# Patient Record
Sex: Female | Born: 1980 | Race: White | Hispanic: No | Marital: Single | Smoking: Current every day smoker
Health system: Southern US, Community
[De-identification: ages and names within clinical notes are randomized; demographics above are authoritative.]

## PROBLEM LIST (undated history)

## (undated) HISTORY — PX: GALLBLADDER SURGERY: SHX652

---

## 2004-10-23 ENCOUNTER — Observation Stay: Payer: Self-pay | Admitting: Obstetrics and Gynecology

## 2004-12-02 ENCOUNTER — Observation Stay: Payer: Self-pay

## 2005-01-03 ENCOUNTER — Observation Stay: Payer: Self-pay | Admitting: Obstetrics and Gynecology

## 2005-01-10 ENCOUNTER — Inpatient Hospital Stay: Payer: Self-pay

## 2005-07-21 ENCOUNTER — Emergency Department: Payer: Self-pay | Admitting: Emergency Medicine

## 2005-09-18 ENCOUNTER — Ambulatory Visit: Payer: Self-pay | Admitting: General Surgery

## 2010-01-08 ENCOUNTER — Ambulatory Visit: Payer: Self-pay | Admitting: Family Medicine

## 2011-07-13 ENCOUNTER — Ambulatory Visit: Payer: Self-pay | Admitting: Internal Medicine

## 2011-09-24 ENCOUNTER — Inpatient Hospital Stay: Payer: Self-pay

## 2012-05-07 ENCOUNTER — Ambulatory Visit: Payer: Self-pay | Admitting: Family Medicine

## 2017-10-28 NOTE — L&D Delivery Note (Signed)
Delivery Note At 9:54 PM a viable and healthy female "Aline Brochure" was delivered via Vaginal, Spontaneous (Presentation: LOA ).  APGAR: 8, 9; weight  pending   Placenta status: spntaneous.  Cord: 3vc with the following complications: none.  Anesthesia:  none Episiotomy: None Lacerations: None Suture Repair: none Est. Blood Loss (mL): 200  Mom to postpartum.  Baby to Couplet care / Skin to Skin.  37yo [id number] at 39+0wks admitted with contractions at term and found to have late decels on monitoring. She was augmented with pitocin and AROM for  Meconium stained fluid. She progressed to fully dilated without an epidural, but with iv stadol, and began to push involuntarily over an intact perineum. She delivered with the RN at the bedside and I arrived as the baby was placed on maternal abdomen. There were no lacerations. I doubly clamped and cut the cord and the placenta delivered spontaneously, intact. 3VC, small periclioral laceration that was hemostatic, and her fundus was firm. She received postpartum pitocin and bleeding was minimal. She and baby tolerated the procedure well.  She plans for a postpartum BTL. This has been posted.  [name] 07/30/2018, 11:22 PM

## 2018-01-08 LAB — OB RESULTS CONSOLE HEPATITIS B SURFACE ANTIGEN: Hepatitis B Surface Ag: NEGATIVE

## 2018-01-08 LAB — OB RESULTS CONSOLE RPR: RPR: NONREACTIVE

## 2018-01-08 LAB — OB RESULTS CONSOLE VARICELLA ZOSTER ANTIBODY, IGG: VARICELLA IGG: IMMUNE

## 2018-01-08 LAB — OB RESULTS CONSOLE RUBELLA ANTIBODY, IGM: RUBELLA: IMMUNE

## 2018-01-08 LAB — OB RESULTS CONSOLE ABO/RH: RH TYPE: NEGATIVE

## 2018-01-08 LAB — OB RESULTS CONSOLE HIV ANTIBODY (ROUTINE TESTING): HIV: NONREACTIVE

## 2018-01-09 ENCOUNTER — Other Ambulatory Visit: Payer: Self-pay | Admitting: Obstetrics and Gynecology

## 2018-01-09 DIAGNOSIS — Z369 Encounter for antenatal screening, unspecified: Secondary | ICD-10-CM

## 2018-01-29 ENCOUNTER — Ambulatory Visit (HOSPITAL_BASED_OUTPATIENT_CLINIC_OR_DEPARTMENT_OTHER)
Admission: RE | Admit: 2018-01-29 | Discharge: 2018-01-29 | Disposition: A | Discharge status: Home / Self Care | Payer: Medicaid Other | Source: Ambulatory Visit | Attending: Maternal & Fetal Medicine | Admitting: Maternal & Fetal Medicine

## 2018-01-29 ENCOUNTER — Ambulatory Visit
Admission: RE | Admit: 2018-01-29 | Discharge: 2018-01-29 | Disposition: A | Discharge status: Home / Self Care | Payer: Medicaid Other | Source: Ambulatory Visit | Attending: Obstetrics and Gynecology | Admitting: Obstetrics and Gynecology

## 2018-01-29 VITALS — BP 132/89 | HR 109 | Temp 98.1°F | Resp 18 | Ht 64.8 in | Wt 164.0 lb

## 2018-01-29 DIAGNOSIS — Z3689 Encounter for other specified antenatal screening: Secondary | ICD-10-CM | POA: Insufficient documentation

## 2018-01-29 DIAGNOSIS — Z369 Encounter for antenatal screening, unspecified: Secondary | ICD-10-CM

## 2018-01-29 DIAGNOSIS — O99331 Smoking (tobacco) complicating pregnancy, first trimester: Secondary | ICD-10-CM | POA: Insufficient documentation

## 2018-01-29 DIAGNOSIS — Z3A13 13 weeks gestation of pregnancy: Secondary | ICD-10-CM | POA: Insufficient documentation

## 2018-01-29 DIAGNOSIS — O09521 Supervision of elderly multigravida, first trimester: Secondary | ICD-10-CM | POA: Insufficient documentation

## 2018-01-29 DIAGNOSIS — F17210 Nicotine dependence, cigarettes, uncomplicated: Secondary | ICD-10-CM | POA: Diagnosis not present

## 2018-01-29 NOTE — Progress Notes (Addendum)
Referring Provider:   [facility] Length of Consultation: 40 minutes  Ms. Bitton [facility] for genetic counseling because of advanced maternal age.  The patient will be 37 years old at the time of delivery.  This note summarizes the information we discussed.    We explained that the chance of a chromosome abnormality increases with maternal age.  Chromosomes and examples of chromosome problems were reviewed.  Humans typically have 46 chromosomes in each cell, with half passed through each sperm and egg.  Any change in the number or structure of chromosomes can increase the risk of problems in the physical and mental development of a pregnancy.   Based upon age of the patient, the chance of any chromosome abnormality was 1 in 7687. The chance of Down syndrome, the most common chromosome problem associated with maternal age, was 1 in 89175.  The risk of chromosome problems is in addition to the 3% general population risk for birth defects and mental retardation.  The greatest chance, of course, is that the baby would be born in good health.  We discussed the following prenatal screening and testing options for this pregnancy:  First trimester screening, which includes nuchal translucency ultrasound screen and first trimester maternal serum marker screening.  The nuchal translucency has approximately an 80% detection rate for Down syndrome and can be positive for other chromosome abnormalities as well as heart defects.  When combined with a maternal serum marker screening, the detection rate is up to 90% for Down syndrome and up to 97% for trisomy 18.     The chorionic villus sampling procedure is available for first trimester chromosome analysis.  This involves the withdrawal of a small amount of chorionic villi (tissue from the developing placenta).  Risk of pregnancy loss is estimated to be approximately 1 in 200 to 1 in 100 (0.5 to 1%).  There is  approximately a 1% (1 in 100) chance that the CVS chromosome results will be unclear.  Chorionic villi cannot be tested for neural tube defects.     Maternal serum marker screening, a blood test that measures pregnancy proteins, can provide risk assessments for Down syndrome, trisomy 18, and open neural tube defects (spina bifida, anencephaly). Because it does not directly examine the fetus, it cannot positively diagnose or rule out these problems.  Targeted ultrasound uses high frequency sound waves to create an image of the developing fetus.  An ultrasound is often recommended as a routine means of evaluating the pregnancy.  It is also used to screen for fetal anatomy problems (for example, a heart defect) that might be suggestive of a chromosomal or other abnormality.   Amniocentesis involves the removal of a small amount of amniotic fluid from the sac surrounding the fetus with the use of a thin needle inserted through the maternal abdomen and uterus.  Ultrasound guidance is used throughout the procedure.  Fetal cells from amniotic fluid are directly evaluated and > 99.5% of chromosome problems and > 98% of open neural tube defects can be detected. This procedure is generally performed after the 15th week of pregnancy.  The main risks to this procedure include complications leading to miscarriage in less than 1 in 200 cases (0.5%).  We also reviewed the availability of cell free fetal DNA testing from maternal blood to determine whether or not the baby may have either Down syndrome, trisomy 5113, or trisomy 10418.  This test utilizes a maternal blood sample and  DNA sequencing technology to isolate circulating cell free fetal DNA from maternal plasma.  The fetal DNA can then be analyzed for DNA sequences that are derived from the three most common chromosomes involved in aneuploidy, chromosomes 13, 18, and 21.  If the overall amount of DNA is greater than the expected level for any of these chromosomes,  aneuploidy is suspected.  While we do not consider it a replacement for invasive testing and karyotype analysis, a negative result from this testing would be reassuring, though not a guarantee of a normal chromosome complement for the baby.  An abnormal result is certainly suggestive of an abnormal chromosome complement, though we would still recommend CVS or amniocentesis to confirm any findings from this testing.  Cystic Fibrosis and Spinal Muscular Atrophy (SMA) screening were also discussed with the patient. Both conditions are recessive, which means that both parents must be carriers in order to have a child with the disease.  Cystic fibrosis (CF) is one of the most common genetic conditions in persons of Caucasian ancestry.  This condition occurs in approximately 1 in 2,500 Caucasian persons and results in thickened secretions in the lungs, digestive, and reproductive systems.  For a baby to be at risk for having CF, both of the parents must be carriers for this condition.  Approximately 1 in 33 Caucasian persons is a carrier for CF.  Current carrier testing looks for the most common mutations in the gene for CF and can detect approximately 90% of carriers in the Caucasian population.  This means that the carrier screening can greatly reduce, but cannot eliminate, the chance for an individual to have a child with CF.  If an individual is found to be a carrier for CF, then carrier testing would be available for the partner. As part of [place]'s newborn screening profile, all babies born in the state of [place] will have a two-tier screening process.  Specimens are first tested to determine the concentration of immunoreactive trypsinogen (IRT).  The top 5% of specimens with the highest IRT values then undergo DNA testing using a panel of over 40 common CF mutations. SMA is a neurodegenerative disorder that leads to atrophy of skeletal muscle and overall weakness.  This condition is also more  prevalent in the Caucasian population, with 1 in 40-1 in 60 persons being a carrier and 1 in 6,000-1 in 10,000 children being affected.  There are multiple forms of the disease, with some causing death in infancy to other forms with survival into adulthood.  The genetics of SMA is complex, but carrier screening can detect up to 95% of carriers in the Caucasian population.  Similar to CF, a negative result can greatly reduce, but cannot eliminate, the chance to have a child with SMA.  We obtained a detailed family history and pregnancy history.  Ms. White reported that her maternal grandmother was born deaf.  She was one of seven siblings, and no one else in the family has hearing loss. We reviewed that there may be many reasons for hearing loss including congenital infections, postnatal factors and genetic conditions. The grandmother had no other physical or developmental differences that would suggest an underlying genetic syndrome.  However, without additional medical information about the cause for her condition, we cannot accurately assess the chance for other family members to have a similar condition. Hearing screening is performed on all babies in [place] as part of newborn screening prior to leaving the hospital.  The remainder of the family  history is unremarkable for birth defects, developmental delays, recurrent pregnancy loss or known chromosome abnormalities.  Ms. Quesenberry stated that this is her fourth pregnancy, the second with her current partner.  They have a healthy 72 year old daughter and she has two daughters, ages 79 and 66, who are also in good health.  Ms. Arreaga smokes cigarettes, down from 1/2 pack per day to about 3 per day.  As we discussed, smoking during pregnancy has been associated with low birth weight, premature delivery and pregnancy loss.  For this reason, we suggest that she cut back or avoid smoking for the remainder of the pregnancy. She reported no other complications or exposures  in this pregnancy that would be expected to increase the risk for birth defects.  After consideration of the options, Ms. Cregg elected to proceed with an ultrasound only and to decline all other screening and testing options.  An ultrasound was performed at the time of the visit.  The gestational age was consistent with 13 weeks.  Fetal anatomy could not be assessed due to early gestational age.  Please refer to the ultrasound report for details of that study.  Ms. Salinas was encouraged to call with questions or concerns.  We can be contacted at [phone].   Tests Ordered: none   Cherly Anderson, MS, CGC  I was immediately available and supervising. Argentina Ponder, MD [facility]

## 2018-07-10 LAB — OB RESULTS CONSOLE GC/CHLAMYDIA
CHLAMYDIA, DNA PROBE: NEGATIVE
GC PROBE AMP, GENITAL: NEGATIVE

## 2018-07-10 LAB — OB RESULTS CONSOLE GBS: GBS: NEGATIVE

## 2018-07-30 ENCOUNTER — Other Ambulatory Visit: Payer: Self-pay

## 2018-07-30 ENCOUNTER — Inpatient Hospital Stay
Admission: EM | Admit: 2018-07-30 | Discharge: 2018-08-01 | DRG: 798 | Disposition: A | Discharge status: Home / Self Care | Payer: BLUE CROSS/BLUE SHIELD | Attending: Obstetrics and Gynecology | Admitting: Obstetrics and Gynecology

## 2018-07-30 DIAGNOSIS — F17210 Nicotine dependence, cigarettes, uncomplicated: Secondary | ICD-10-CM | POA: Diagnosis present

## 2018-07-30 DIAGNOSIS — Z3A39 39 weeks gestation of pregnancy: Secondary | ICD-10-CM | POA: Diagnosis not present

## 2018-07-30 DIAGNOSIS — Z302 Encounter for sterilization: Secondary | ICD-10-CM | POA: Diagnosis not present

## 2018-07-30 DIAGNOSIS — Z3483 Encounter for supervision of other normal pregnancy, third trimester: Secondary | ICD-10-CM | POA: Diagnosis present

## 2018-07-30 DIAGNOSIS — O99334 Smoking (tobacco) complicating childbirth: Secondary | ICD-10-CM | POA: Diagnosis present

## 2018-07-30 DIAGNOSIS — O0993 Supervision of high risk pregnancy, unspecified, third trimester: Secondary | ICD-10-CM

## 2018-07-30 LAB — CBC
HCT: 34.3 % — ABNORMAL LOW (ref 35.0–47.0)
Hemoglobin: 12.3 g/dL (ref 12.0–16.0)
MCH: 32.5 pg (ref 26.0–34.0)
MCHC: 36 g/dL (ref 32.0–36.0)
MCV: 90.5 fL (ref 80.0–100.0)
PLATELETS: 201 10*3/uL (ref 150–440)
RBC: 3.79 MIL/uL — AB (ref 3.80–5.20)
RDW: 14 % (ref 11.5–14.5)
WBC: 16.2 10*3/uL — AB (ref 3.6–11.0)

## 2018-07-30 LAB — RAPID HIV SCREEN (HIV 1/2 AB+AG)
HIV 1/2 Antibodies: NONREACTIVE
HIV-1 P24 ANTIGEN - HIV24: NONREACTIVE

## 2018-07-30 LAB — TYPE AND SCREEN
ABO/RH(D): O NEG
Antibody Screen: NEGATIVE

## 2018-07-30 MED ORDER — OXYTOCIN BOLUS FROM INFUSION
500.0000 mL | Freq: Once | INTRAVENOUS | Status: AC
  Administered 2018-07-30: 500 mL via INTRAVENOUS

## 2018-07-30 MED ORDER — METOCLOPRAMIDE HCL 10 MG PO TABS
10.0000 mg | ORAL_TABLET | Freq: Once | ORAL | Status: AC
  Administered 2018-07-31: 10 mg via ORAL
  Filled 2018-07-30 (×2): qty 1

## 2018-07-30 MED ORDER — LIDOCAINE HCL (PF) 1 % IJ SOLN
INTRAMUSCULAR | Status: AC
  Filled 2018-07-30: qty 30

## 2018-07-30 MED ORDER — BUTORPHANOL TARTRATE 1 MG/ML IJ SOLN
1.0000 mg | INTRAMUSCULAR | Status: DC | PRN
  Administered 2018-07-30: 1 mg via INTRAVENOUS
  Filled 2018-07-30: qty 1

## 2018-07-30 MED ORDER — LACTATED RINGERS IV SOLN: INTRAVENOUS | Status: DC

## 2018-07-30 MED ORDER — TERBUTALINE SULFATE 1 MG/ML IJ SOLN: 0.2500 mg | Freq: Once | INTRAMUSCULAR | Status: DC | PRN

## 2018-07-30 MED ORDER — AMMONIA AROMATIC IN INHA
RESPIRATORY_TRACT | Status: AC
  Filled 2018-07-30: qty 10

## 2018-07-30 MED ORDER — IBUPROFEN 600 MG PO TABS
ORAL_TABLET | ORAL | Status: AC
  Filled 2018-07-30: qty 1

## 2018-07-30 MED ORDER — ONDANSETRON HCL 4 MG/2ML IJ SOLN: 4.0000 mg | Freq: Four times a day (QID) | INTRAMUSCULAR | Status: DC | PRN

## 2018-07-30 MED ORDER — IBUPROFEN 600 MG PO TABS
600.0000 mg | ORAL_TABLET | Freq: Four times a day (QID) | ORAL | Status: DC
  Administered 2018-07-31 – 2018-08-01 (×5): 600 mg via ORAL
  Filled 2018-07-30 (×5): qty 1

## 2018-07-30 MED ORDER — ACETAMINOPHEN 325 MG PO TABS: 650.0000 mg | ORAL_TABLET | ORAL | Status: DC | PRN

## 2018-07-30 MED ORDER — OXYTOCIN 10 UNIT/ML IJ SOLN
INTRAMUSCULAR | Status: AC
  Filled 2018-07-30: qty 2

## 2018-07-30 MED ORDER — OXYTOCIN 40 UNITS IN LACTATED RINGERS INFUSION - SIMPLE MED: 2.5000 [IU]/h | INTRAVENOUS | Status: DC

## 2018-07-30 MED ORDER — SOD CITRATE-CITRIC ACID 500-334 MG/5ML PO SOLN: 30.0000 mL | ORAL | Status: DC | PRN

## 2018-07-30 MED ORDER — FAMOTIDINE 20 MG PO TABS
40.0000 mg | ORAL_TABLET | Freq: Once | ORAL | Status: AC
  Administered 2018-07-31: 40 mg via ORAL
  Filled 2018-07-30: qty 2

## 2018-07-30 MED ORDER — LACTATED RINGERS IV SOLN: 500.0000 mL | INTRAVENOUS | Status: DC | PRN

## 2018-07-30 MED ORDER — MISOPROSTOL 200 MCG PO TABS
ORAL_TABLET | ORAL | Status: AC
  Filled 2018-07-30: qty 4

## 2018-07-30 MED ORDER — LACTATED RINGERS IV SOLN
INTRAVENOUS | Status: DC
  Administered 2018-07-31: 05:00:00 via INTRAVENOUS

## 2018-07-30 MED ORDER — OXYTOCIN 40 UNITS IN LACTATED RINGERS INFUSION - SIMPLE MED
1.0000 m[IU]/min | INTRAVENOUS | Status: DC
  Administered 2018-07-30: 2 m[IU]/min via INTRAVENOUS

## 2018-07-30 MED ORDER — LIDOCAINE HCL (PF) 1 % IJ SOLN: 30.0000 mL | INTRAMUSCULAR | Status: DC | PRN

## 2018-07-30 MED ORDER — BENZOCAINE-MENTHOL 20-0.5 % EX AERO
INHALATION_SPRAY | CUTANEOUS | Status: AC
  Administered 2018-07-31
  Filled 2018-07-30: qty 56

## 2018-07-30 NOTE — OB Triage Note (Signed)
Pt presents c/o ctx that are 12-18 mins apart. Pt reports spotting as well. Pt was seen in office today and had cervical exam. Denies any LOf. Reports positive fetal movement. Vitals WNL. Will continue to monitor.

## 2018-07-30 NOTE — Progress Notes (Signed)
Rebecca Spencer is a 37 y.o. G4P3000 at [redacted]w[redacted]d   Subjective: Feeling very uncomfortable  Objective: BP 137/82 (BP Location: Left Arm)   Pulse 90   Temp 98.2 F (36.8 C) (Oral)   Resp 18   Ht 5\' 4"  (1.626 m)   Wt 79.8 kg   LMP 10/30/2017   SpO2 99%   BMI 30.21 kg/m  No intake/output data recorded. No intake/output data recorded.  FHT:  FHR: 145 bpm, variability: min to mod variability,  accelerations:  Present,  decelerations:  Present lates UC:   regular, every 3-4 minutes SVE:   Dilation: 5 Effacement (%): 100 Station: -2 Exam by:: Dr. Dalbert Spencer  Labs: Lab Results  Component Value Date   WBC 16.2 (H) 07/30/2018   HGB 12.3 07/30/2018   HCT 34.3 (L) 07/30/2018   MCV 90.5 07/30/2018   PLT 201 07/30/2018    Assessment / Plan: Augmentation of labor, progressing well  Labor: Active, AROM for clear/bloody fluid Fetal Wellbeing:  Category II Pain Control:  Labor support without medications I/D:  n/a Anticipated MOD:  NSVD  Narrow pubic arch, but prior >8lb baby and this one feels about 8lbs  Rebecca Spencer 07/30/2018, 8:52 PM

## 2018-07-30 NOTE — Discharge Summary (Addendum)
Obstetrical Discharge Summary  Patient Name: Rebecca Spencer DOB: 09-08-1981 MRN: [id number]  Date of Admission: 07/30/2018 Date of Discharge: 08/01/2018  Primary OB: [facility]   Gestational Age at Delivery: [redacted]w[redacted]d   Antepartum complications:  - AMA - Smoking - Hx of fast labor - GBS neg - ASCUS pap - Elevated 1 hr gtt, normal 3hr  Admitting Diagnosis: active labor Secondary Diagnosis:Cat II strip Patient Active Problem List   Diagnosis Date Noted  . Supervision of high risk pregnancy in third trimester 07/30/2018  . Advanced maternal age in multigravida, first trimester     Augmentation: AROM and Pitocin Complications: None Intrapartum complications/course:  14YQ [id number] at 39+0wks admitted with contractions at term and found to have late decels on monitoring. She was augmented with pitocin and AROM for  Meconium stained fluid. She progressed to fully dilated without an epidural, but with iv stadol, and began to push involuntarily over an intact perineum. She delivered with the RN at the bedside and I arrived as the baby was placed on maternal abdomen. There were no lacerations. I doubly clamped and cut the cord and the placenta delivered spontaneously, intact. 3VC, small periclioral laceration that was hemostatic, and her fundus was firm. She received postpartum pitocin and bleeding was minimal. She and baby tolerated the procedure well.   Date of Delivery: 07/30/18 Delivered By: [name] Delivery Type: spontaneous vaginal delivery Anesthesia: none Placenta: Spontaneous Laceration: none Episiotomy: none Newborn Data: Live born female "Jerrie Willow" Birth Weight:   APGAR: 8, 9  Newborn Delivery   Birth date/time:  07/30/2018 21:54:00 Delivery type:  Vaginal, Spontaneous     Postpartum procedures:  Postpartum tubal ligation  Discharge Physical Exam:  BP 123/85 (BP Location: Right Arm)   Pulse 74   Temp (!) 97.5 F (36.4 C) (Oral)   Resp 18    Ht 5\' 4"  (1.626 m)   Wt 79.8 kg   LMP 10/30/2017   SpO2 100%   Breastfeeding? No   BMI 30.21 kg/m   General: NAD CV: RRR Pulm: CTABL, nl effort ABD: s/nd/nt, fundus firm and below the umbilicus Lochia: moderate Incision: c/d/i  DVT Evaluation: LE non-ttp, no evidence of DVT on exam.  Hemoglobin  Date Value Ref Range Status  07/31/2018 11.0 (L) 12.0 - 16.0 g/dL Final   HCT  Date Value Ref Range Status  07/31/2018 30.2 (L) 35.0 - 47.0 % Final    Post partum course: routine Postpartum Procedures: Postpartum BTL Disposition: stable, discharge to home. Baby Feeding: formula Baby Disposition: home with mom  Rh Immune globulin given:n/a Rubella vaccine given: n/a Tdap vaccine given in AP or PP setting: 05/21/18 Flu vaccine given in AP or PP setting: offered pp  Contraception: BTL   Prenatal Labs: Blood type/Rh --/--/O NEG (10/03 2054)  Antibody screen neg  Rubella Immune  Varicella Immune  RPR NR  HBsAg Neg  HIV NR  GC neg  Chlamydia neg  Genetic screening negative  1 hour GTT 163  3 hour GTT 83 143 116 122  GBS negative               Plan:  Glade Stanford was discharged to home in good condition. [facility]-up appointment at [facility] with delivering provider in 5-6 weeks    Discharge Medications: Allergies as of 08/01/2018      Reactions   Latex Rash      Medication List    TAKE these medications   ibuprofen 600 MG tablet  Commonly known as:  ADVIL,MOTRIN Take 1 tablet (600 mg total) by mouth every 6 (six) hours.   oxyCODONE 5 MG immediate release tablet Commonly known as:  Oxy IR/ROXICODONE Take 1 tablet (5 mg total) by mouth every 4 (four) hours as needed (pain scale 4-7).       Follow-up Information    [name], MD Follow up in 5 week(s).   Specialty:  Obstetrics and Gynecology Contact information: [address] [place] [address] [phone]           Signed: ----- Genia Del,  CNM 08/01/2018 10:16 AM

## 2018-07-30 NOTE — H&P (Signed)
OB ADMISSION/ HISTORY & PHYSICAL:  Admission Date: 07/30/2018  3:18 PM  Admit Diagnosis: Contractions, Cat II strip  Rebecca Spencer is a 37 y.o. female presenting for contractions at term, found to have late decels with minimal variability in triage.  Prenatal History: G4P3003 at 39+0wks EDC : 08/06/2018, by Last Menstrual Period  [facility] at [facility] Prenatal course complicated by  - AMA - Smoking - Hx of fast labor - GBS neg - ASCUS pap - Elevated 1 hr gtt, normal 3hr  Medical / Surgical History :  Past medical history: History reviewed. No pertinent past medical history.   Past surgical history:  Past Surgical History:  Procedure Laterality Date  . GALLBLADDER SURGERY      Family History:  Family History  Problem Relation Age of Onset  . Cancer Maternal Grandmother   . Heart disease Maternal Grandfather   . Cancer Paternal Grandmother   . Heart disease Paternal Grandfather      Social History:  reports that she has been smoking cigarettes. She has been smoking about 0.50 packs per day. She has never used smokeless tobacco. She reports that she does not drink alcohol or use drugs.   Allergies: Latex    Current Medications at time of admission:  Prior to Admission medications   Not on File     Review of Systems: Active FM onset of ctx @ 12:00 currently every 3 minutes LOF  / SROM: intact bloody show yes   Physical Exam:  VS: Blood pressure 137/82, pulse 90, temperature 98.2 F (36.8 C), temperature source Oral, resp. rate 18, height 5\' 4"  (1.626 m), weight 79.8 kg, last menstrual period 10/30/2017, SpO2 99 %.  General: alert and oriented, appears in moderate distress Heart: RRR Lungs: Clear lung fields Abdomen: Gravid, soft and non-tender, non-distended / uterus: nad Extremities: no edema  FHT: 140, minimal-moderate variability, +accels, +late and variable decels TOCO: q3 SVE:  Dilation: 4.5 / Effacement (%): 60 / Station: -3    Cephalic by  leopolds  Prenatal Labs: Blood type/Rh    Antibody screen neg  Rubella Immune  Varicella Immune  RPR NR  HBsAg Neg  HIV NR  GC neg  Chlamydia neg  Genetic screening negative  1 hour GTT 163  3 hour GTT 83 143 116 122  GBS negative   No results found.  Assessment: 39+[redacted] weeks gestation 1 stage of labor FHR category Cat II   Plan:  Admit for active labor and Cat II strip Labs pending Epidural when desired Continuous fetal monitoring Will augment with AROM and pitocin   1. Fetal Well being  - Fetal Tracing: Cat II - Ultrasound:  03/11/18: Anatomy scan Single IUP S=[redacted]w[redacted]d FHT 153BPM Cervix closed=4.97cm Bilat Ovaries imaged=WNL Planceta=anterior Postion=transvers, head rt  reviewed, as above - Group B Streptococcus: neg - Presentation: vtx confirmed by Leopolds   2. Routine OB: - Prenatal labs reviewed, as above - Rh O negative  3. Induction of Labor:  -  Contractions external toco in place -  Plan for induction with AROM and pitocin  4. Post Partum Planning: - Infant feeding: nottle - Contraception: BTL postpartu, papers singed 03/11/18

## 2018-07-31 ENCOUNTER — Encounter: Payer: Self-pay | Admitting: *Deleted

## 2018-07-31 ENCOUNTER — Encounter
Admission: EM | Disposition: A | Discharge status: Home / Self Care | Payer: Self-pay | Source: Home / Self Care | Attending: Obstetrics and Gynecology

## 2018-07-31 ENCOUNTER — Inpatient Hospital Stay: Payer: BLUE CROSS/BLUE SHIELD | Admitting: Anesthesiology

## 2018-07-31 HISTORY — PX: TUBAL LIGATION: SHX77

## 2018-07-31 LAB — CBC
HEMATOCRIT: 30.2 % — AB (ref 35.0–47.0)
HEMOGLOBIN: 11 g/dL — AB (ref 12.0–16.0)
MCH: 33.6 pg (ref 26.0–34.0)
MCHC: 36.5 g/dL — AB (ref 32.0–36.0)
MCV: 92 fL (ref 80.0–100.0)
Platelets: 164 10*3/uL (ref 150–440)
RBC: 3.28 MIL/uL — ABNORMAL LOW (ref 3.80–5.20)
RDW: 13.8 % (ref 11.5–14.5)
WBC: 17.8 10*3/uL — ABNORMAL HIGH (ref 3.6–11.0)

## 2018-07-31 SURGERY — LIGATION, FALLOPIAN TUBE, POSTPARTUM
Anesthesia: Spinal | Site: Abdomen | Laterality: Bilateral

## 2018-07-31 MED ORDER — BUPIVACAINE HCL (PF) 0.5 % IJ SOLN
INTRAMUSCULAR | Status: AC
  Filled 2018-07-31: qty 30

## 2018-07-31 MED ORDER — ONDANSETRON HCL 4 MG/2ML IJ SOLN: 4.0000 mg | INTRAMUSCULAR | Status: DC | PRN

## 2018-07-31 MED ORDER — ZOLPIDEM TARTRATE 5 MG PO TABS: 5.0000 mg | ORAL_TABLET | Freq: Every evening | ORAL | Status: DC | PRN

## 2018-07-31 MED ORDER — IBUPROFEN 600 MG PO TABS: 600.0000 mg | ORAL_TABLET | Freq: Four times a day (QID) | ORAL | 1 refills | Status: DC

## 2018-07-31 MED ORDER — BISACODYL 10 MG RE SUPP: 10.0000 mg | Freq: Every day | RECTAL | Status: DC | PRN

## 2018-07-31 MED ORDER — MEASLES, MUMPS & RUBELLA VAC ~~LOC~~ INJ: 0.5000 mL | INJECTION | Freq: Once | SUBCUTANEOUS | Status: DC

## 2018-07-31 MED ORDER — FENTANYL CITRATE (PF) 100 MCG/2ML IJ SOLN
INTRAMUSCULAR | Status: AC
  Filled 2018-07-31: qty 2

## 2018-07-31 MED ORDER — ONDANSETRON HCL 4 MG/2ML IJ SOLN
INTRAMUSCULAR | Status: DC | PRN
  Administered 2018-07-31: 4 mg via INTRAVENOUS

## 2018-07-31 MED ORDER — SENNOSIDES-DOCUSATE SODIUM 8.6-50 MG PO TABS
2.0000 | ORAL_TABLET | ORAL | Status: DC
  Administered 2018-07-31: 2 via ORAL
  Filled 2018-07-31: qty 2

## 2018-07-31 MED ORDER — PRENATAL MULTIVITAMIN CH: 1.0000 | ORAL_TABLET | Freq: Every day | ORAL | Status: DC

## 2018-07-31 MED ORDER — ONDANSETRON HCL 4 MG PO TABS: 4.0000 mg | ORAL_TABLET | ORAL | Status: DC | PRN

## 2018-07-31 MED ORDER — SODIUM CHLORIDE 0.9% FLUSH: 3.0000 mL | Freq: Two times a day (BID) | INTRAVENOUS | Status: DC

## 2018-07-31 MED ORDER — OXYCODONE HCL 5 MG PO TABS: 5.0000 mg | ORAL_TABLET | ORAL | Status: DC | PRN

## 2018-07-31 MED ORDER — BUPIVACAINE IN DEXTROSE 0.75-8.25 % IT SOLN
INTRATHECAL | Status: DC | PRN
  Administered 2018-07-31: 2 mL via INTRATHECAL

## 2018-07-31 MED ORDER — DIBUCAINE 1 % RE OINT: 1.0000 "application " | TOPICAL_OINTMENT | RECTAL | Status: DC | PRN

## 2018-07-31 MED ORDER — OXYCODONE HCL 5 MG PO TABS: 5.0000 mg | ORAL_TABLET | Freq: Once | ORAL | Status: DC | PRN

## 2018-07-31 MED ORDER — FENTANYL CITRATE (PF) 100 MCG/2ML IJ SOLN: 25.0000 ug | INTRAMUSCULAR | Status: DC | PRN

## 2018-07-31 MED ORDER — MEPERIDINE HCL 50 MG/ML IJ SOLN: 6.2500 mg | INTRAMUSCULAR | Status: DC | PRN

## 2018-07-31 MED ORDER — SIMETHICONE 80 MG PO CHEW: 80.0000 mg | CHEWABLE_TABLET | ORAL | Status: DC | PRN

## 2018-07-31 MED ORDER — SODIUM CHLORIDE 0.9% FLUSH: 3.0000 mL | INTRAVENOUS | Status: DC | PRN

## 2018-07-31 MED ORDER — BENZOCAINE-MENTHOL 20-0.5 % EX AERO: 1.0000 "application " | INHALATION_SPRAY | CUTANEOUS | Status: DC | PRN

## 2018-07-31 MED ORDER — TETANUS-DIPHTH-ACELL PERTUSSIS 5-2.5-18.5 LF-MCG/0.5 IM SUSP: 0.5000 mL | Freq: Once | INTRAMUSCULAR | Status: DC

## 2018-07-31 MED ORDER — PROPOFOL 10 MG/ML IV BOLUS
INTRAVENOUS | Status: AC
  Filled 2018-07-31: qty 20

## 2018-07-31 MED ORDER — FLEET ENEMA 7-19 GM/118ML RE ENEM: 1.0000 | ENEMA | Freq: Every day | RECTAL | Status: DC | PRN

## 2018-07-31 MED ORDER — OXYCODONE HCL 5 MG PO TABS: 5.0000 mg | ORAL_TABLET | ORAL | 0 refills | Status: DC | PRN

## 2018-07-31 MED ORDER — COCONUT OIL OIL: 1.0000 "application " | TOPICAL_OIL | Status: DC | PRN

## 2018-07-31 MED ORDER — SODIUM CHLORIDE 0.9 % IV SOLN: 250.0000 mL | INTRAVENOUS | Status: DC | PRN

## 2018-07-31 MED ORDER — PROMETHAZINE HCL 25 MG/ML IJ SOLN: 6.2500 mg | INTRAMUSCULAR | Status: DC | PRN

## 2018-07-31 MED ORDER — MIDAZOLAM HCL 2 MG/2ML IJ SOLN
INTRAMUSCULAR | Status: AC
  Filled 2018-07-31: qty 2

## 2018-07-31 MED ORDER — FENTANYL CITRATE (PF) 100 MCG/2ML IJ SOLN
INTRAMUSCULAR | Status: DC | PRN
  Administered 2018-07-31: 15 ug via INTRATHECAL

## 2018-07-31 MED ORDER — DIPHENHYDRAMINE HCL 25 MG PO CAPS: 25.0000 mg | ORAL_CAPSULE | Freq: Four times a day (QID) | ORAL | Status: DC | PRN

## 2018-07-31 MED ORDER — MIDAZOLAM HCL 5 MG/5ML IJ SOLN
INTRAMUSCULAR | Status: DC | PRN
  Administered 2018-07-31 (×2): 1 mg via INTRAVENOUS

## 2018-07-31 MED ORDER — ACETAMINOPHEN 325 MG PO TABS: 650.0000 mg | ORAL_TABLET | ORAL | Status: DC | PRN

## 2018-07-31 MED ORDER — OXYCODONE HCL 5 MG/5ML PO SOLN: 5.0000 mg | Freq: Once | ORAL | Status: DC | PRN

## 2018-07-31 MED ORDER — WITCH HAZEL-GLYCERIN EX PADS: 1.0000 "application " | MEDICATED_PAD | CUTANEOUS | Status: DC | PRN

## 2018-07-31 SURGICAL SUPPLY — 36 items
BLADE SURG 15 STRL LF DISP TIS (BLADE) ×1 IMPLANT
BLADE SURG 15 STRL SS (BLADE) ×2
BLADE SURG SZ11 CARB STEEL (BLADE) ×3 IMPLANT
CELL SAVER LIPIGURD (MISCELLANEOUS) ×1 IMPLANT
CHLORAPREP W/TINT 26ML (MISCELLANEOUS) ×3 IMPLANT
DERMABOND ADVANCED (GAUZE/BANDAGES/DRESSINGS) ×2
DERMABOND ADVANCED .7 DNX12 (GAUZE/BANDAGES/DRESSINGS) ×1 IMPLANT
DRAPE LAPAROTOMY 100X77 ABD (DRAPES) ×3 IMPLANT
DRSG TEGADERM 2-3/8X2-3/4 SM (GAUZE/BANDAGES/DRESSINGS) ×3 IMPLANT
DRSG TELFA 4X3 1S NADH ST (GAUZE/BANDAGES/DRESSINGS) ×3 IMPLANT
ELECT CAUTERY BLADE 6.4 (BLADE) ×3 IMPLANT
ELECT REM PT RETURN 9FT ADLT (ELECTROSURGICAL) ×3
ELECTRODE REM PT RTRN 9FT ADLT (ELECTROSURGICAL) ×1 IMPLANT
EXTRT SYSTEM ALEXIS 14CM (MISCELLANEOUS) ×3
GLOVE PI ORTHOPRO 6.5 (GLOVE) ×2
GLOVE PI ORTHOPRO STRL 6.5 (GLOVE) ×1 IMPLANT
GLOVE SURG SYN 6.5 ES PF (GLOVE) ×3 IMPLANT
GOWN STRL REUS W/ TWL LRG LVL3 (GOWN DISPOSABLE) ×2 IMPLANT
GOWN STRL REUS W/TWL LRG LVL3 (GOWN DISPOSABLE) ×4
KIT TURNOVER CYSTO (KITS) ×3 IMPLANT
LABEL OR SOLS (LABEL) ×3 IMPLANT
LIGASURE VESSEL 5MM BLUNT TIP (ELECTROSURGICAL) IMPLANT
NEEDLE HYPO 22GX1.5 SAFETY (NEEDLE) ×3 IMPLANT
NS IRRIG 500ML POUR BTL (IV SOLUTION) ×3 IMPLANT
PACK BASIN MINOR ARMC (MISCELLANEOUS) ×3 IMPLANT
RETRACTOR RING XSMALL (MISCELLANEOUS) IMPLANT
RETRACTOR WOUND ALXS 18CM SML (MISCELLANEOUS) ×2 IMPLANT
RTRCTR WOUND ALEXIS 13CM XS SH (MISCELLANEOUS)
RTRCTR WOUND ALEXIS O 18CM SML (MISCELLANEOUS) ×6
SPONGE LAP 18X18 RF (DISPOSABLE) ×3 IMPLANT
SUT MNCRL AB 4-0 PS2 18 (SUTURE) ×3 IMPLANT
SUT VIC AB 2-0 CT1 27 (SUTURE) ×4
SUT VIC AB 2-0 CT1 TAPERPNT 27 (SUTURE) ×2 IMPLANT
SUT VIC AB 2-0 UR6 27 (SUTURE) ×3 IMPLANT
SYR 10ML LL (SYRINGE) ×3 IMPLANT
TRAY FOLEY MTR SLVR 16FR STAT (SET/KITS/TRAYS/PACK) ×3 IMPLANT

## 2018-07-31 NOTE — Anesthesia Post-op Follow-up Note (Signed)
Anesthesia QCDR form completed.        

## 2018-07-31 NOTE — Anesthesia Postprocedure Evaluation (Signed)
Anesthesia Post Note  Patient: Rebecca Spencer  Procedure(s) Performed: POST PARTUM TUBAL LIGATION (Bilateral Abdomen)  Patient location during evaluation: PACU Anesthesia Type: Spinal Level of consciousness: oriented and awake and alert Pain management: pain level controlled Vital Signs Assessment: post-procedure vital signs reviewed and stable Respiratory status: spontaneous breathing, respiratory function stable and nonlabored ventilation Cardiovascular status: blood pressure returned to baseline and stable Postop Assessment: no headache, no backache and spinal receding Anesthetic complications: no     Last Vitals:  Vitals:   07/31/18 1217 07/31/18 1245  BP: 98/62 121/86  Pulse: 70 80  Resp: 11 16  Temp: 37.1 C 37 C  SpO2: 96% 100%    Last Pain:  Vitals:   07/31/18 1245  TempSrc: Oral  PainSc:                  Jennalee Greaves

## 2018-07-31 NOTE — Transfer of Care (Signed)
Immediate Anesthesia Transfer of Care Note  Patient: Rebecca Spencer  Procedure(s) Performed: POST PARTUM TUBAL LIGATION (Bilateral Abdomen)  Patient Location: PACU  Anesthesia Type:General  Level of Consciousness: sedated  Airway & Oxygen Therapy: Patient Spontanous Breathing and Patient connected to face mask oxygen  Post-op Assessment: Report given to RN and Post -op Vital signs reviewed and stable  Post vital signs: Reviewed and stable  Last Vitals:  Vitals Value Taken Time  BP 93/55 07/31/2018 11:16 AM  Temp 36.2 C 07/31/2018 11:16 AM  Pulse 70 07/31/2018 11:22 AM  Resp 15 07/31/2018 11:22 AM  SpO2 100 % 07/31/2018 11:22 AM  Vitals shown include unvalidated device data.  Last Pain:  Vitals:   07/31/18 1116  TempSrc:   PainSc: 0-No pain      Patients Stated Pain Goal: 0 (07/30/18 1539)  Complications: No apparent anesthesia complications

## 2018-07-31 NOTE — Anesthesia Preprocedure Evaluation (Signed)
Anesthesia Evaluation  Patient identified by MRN, date of birth, ID band Patient awake    Reviewed: Allergy & Precautions, NPO status , Patient's Chart, lab work & pertinent test results  History of Anesthesia Complications Negative for: history of anesthetic complications  Airway Mallampati: II  TM Distance: >3 FB Neck ROM: Full    Dental no notable dental hx.    Pulmonary neg pulmonary ROS, neg sleep apnea, neg COPD, Current Smoker,    breath sounds clear to auscultation- rhonchi (-) wheezing      Cardiovascular Exercise Tolerance: Good (-) hypertension(-) CAD and (-) Past MI  Rhythm:Regular Rate:Normal - Systolic murmurs and - Diastolic murmurs    Neuro/Psych negative neurological ROS  negative psych ROS   GI/Hepatic negative GI ROS, Neg liver ROS,   Endo/Other  negative endocrine ROSneg diabetes  Renal/GU negative Renal ROS     Musculoskeletal negative musculoskeletal ROS (+)   Abdominal (+) + obese,   Peds  Hematology negative hematology ROS (+)   Anesthesia Other Findings   Reproductive/Obstetrics                             Lab Results  Component Value Date   WBC 17.8 (H) 07/31/2018   HGB 11.0 (L) 07/31/2018   HCT 30.2 (L) 07/31/2018   MCV 92.0 07/31/2018   PLT 164 07/31/2018    Anesthesia Physical Anesthesia Plan  ASA: II  Anesthesia Plan: Spinal   Post-op Pain Management:    Induction:   PONV Risk Score and Plan: 1 and Midazolam and Ondansetron  Airway Management Planned: Natural Airway  Additional Equipment:   Intra-op Plan:   Post-operative Plan:   Informed Consent: I have reviewed the patients History and Physical, chart, labs and discussed the procedure including the risks, benefits and alternatives for the proposed anesthesia with the patient or authorized representative who has indicated his/her understanding and acceptance.   Dental advisory  given  Plan Discussed with: CRNA and Anesthesiologist  Anesthesia Plan Comments:         Anesthesia Quick Evaluation

## 2018-07-31 NOTE — Progress Notes (Signed)
Post Partum Day 1 Subjective: Doing well, no complaints.   Has been NPO since midnight and desires BTL today  No CP SOB F/C N/V or leg pain no HA change of vision, RUQ/epigastric pain  Objective: BP 114/74 (BP Location: Right Arm)   Pulse 76   Temp 98.4 F (36.9 C) (Oral)   Resp 18   Ht 5\' 4"  (1.626 m)   Wt 79.8 kg   LMP 10/30/2017   SpO2 99%   Breastfeeding? Unknown   BMI 30.21 kg/m    Physical Exam:  General: NAD CV: RRR Pulm: nl effort, CTABL Lochia: moderate Uterine Fundus: fundus firm and below umbilicus DVT Evaluation: no cords, ttp LEs   Recent Labs    07/30/18 1922 07/31/18 0659  HGB 12.3 11.0*  HCT 34.3* 30.2*  WBC 16.2* 17.8*  PLT 201 164    Assessment/Plan: 37 y.o. G4P4001 postpartum day # 1  1. Ok to proceed with tubal - consent signed, to OR when ready 2. Routine cares, lactation, fundal checks  3. Continue inpatient admission    ----- Ranae Plumber, MD Attending Obstetrician and Gynecologist [facility]

## 2018-07-31 NOTE — Anesthesia Procedure Notes (Addendum)
Spinal  Patient location during procedure: OR Start time: 07/31/2018 10:14 AM End time: 07/31/2018 10:21 AM Staffing Anesthesiologist: Emmie Niemann, MD Resident/CRNA: Justus Memory, CRNA Performed: resident/CRNA  Preanesthetic Checklist Completed: patient identified, site marked, surgical consent, pre-op evaluation, timeout performed, IV checked, risks and benefits discussed and monitors and equipment checked Spinal Block Patient position: sitting Prep: ChloraPrep Patient monitoring: heart rate, continuous pulse ox, blood pressure and cardiac monitor Approach: midline Location: L4-5 Injection technique: single-shot Needle Needle type: Introducer and Pencil-Tip  Needle gauge: 24 G Needle length: 9 cm Additional Notes Negative paresthesia. Negative blood return. Positive free-flowing CSF. Expiration date of kit checked and confirmed. Patient tolerated procedure well, without complications.

## 2018-07-31 NOTE — Progress Notes (Signed)
Patient leaving unit for surgery. Per Dr. Elesa Massed go ahead and given ordered pepcid and reglan, pt denied trying to void. Consent on chart. FOB in room to care for baby while patient is in surgery.

## 2018-07-31 NOTE — Discharge Instructions (Signed)
Discharge instructions:  ° °Call office if you have any of the following: headache, visual changes, fever >101.0 F, chills, breast concerns, excessive vaginal bleeding, incision drainage or problems, leg pain or redness, depression or any other concerns.  ° °Activity: Do not lift > 10 lbs for 6 weeks.  °No intercourse or tampons for 6 weeks.  °No driving for 1-2 weeks.  ° °Call your doctor for increased pain or vaginal bleeding, shortness of breath, temperature above 101.0, depression, or concerns.  No strenuous activity or heavy lifting for 6 weeks.  No intercourse, tampons, douching, or enemas for 6 weeks.  No tub baths-showers only.  No driving for 2 weeks or while taking pain medications.  Continue prenatal vitamin and iron.  Increase calories and fluids while breastfeeding. ° °You may have a slight fever when your milk comes in, but it should go away on its own.  If it does not, and rises above 101.0 please call the doctor. ° °For concerns about your baby, please call your pediatrician °For breastfeeding concerns, the lactation consultant can be reached at [phone] ° ° °

## 2018-08-01 ENCOUNTER — Encounter: Payer: Self-pay | Admitting: Obstetrics & Gynecology

## 2018-08-01 LAB — RPR: RPR: NONREACTIVE

## 2018-08-01 NOTE — Progress Notes (Signed)
Patient discharged home with infant. Discharge instructions and prescriptions given and reviewed with patient. Patient verbalized understanding. Escorted out by staff.  

## 2018-08-01 NOTE — Op Note (Addendum)
  Post Partum Tubal Ligation  Indication for procedure: desired permanent sterilization  Pre-op diagnosis: s/p term vaginal delivery, desired permanent sterilization Post-op diagnosis: same Procedure: post partum bilateral tubal ligation Surgeon: Gordie Belvin Assist: none Anesthesia: general  IVF: 800cc EBL: minimal UOP: none Findings: patent bilateral tubes, normal post-gravid uterine fundus Specimens: portion of left tube, portion of right tube Complications: none apparent Disposition: stable to PACU  Procedure in detail: The patient was seen and confirmed desire for permanent sterilization.  She was identified as Rebecca Spencer and was brought to the OR.  General anesthesia was administered, and the patient was prepped and draped in the usual sterile fashion.  A surgical time-out was called.  An 11-blade was used to incise the skin in the infraumbilical area.  The subcutaneous tissues were dissected to and then through the fascia, and the peritoneum was grasped and divided.  An alexis retractor was placed in the abdominal cavity and positioned.  The uterus was identified, and the right tube was grasped with a babcock and traced to the fimbriated end.  A kelly clamp was placed in a step-wise fashion along the mesosalpinx and metzenbaums were used to transect the tube from the underlying tissue.  2-0 Vicryl was used to tie the remaining tissue.  The same steps were used on the left side.  All dissected ends were found to be hemostatic.  The alexis retractor was removed, and the fascia was reapproximated with 1 vicryl.  The subcutaneous tissue was irrigated and then the skin was closed with 4-0 monocryl.  The skin was covered in surgical glue.  The sponge, needle, and instrument counts were correct x2.  The patient tolerated the procedure and was brought to PACU in a stable condition.  Ranae Plumber, MD Attending Obstetrician and Gynecologist [facility]

## 2018-08-03 LAB — SURGICAL PATHOLOGY

## 2018-10-23 IMAGING — US US MFM OB COMPLETE +14 WKS
1 series · 13 of 28 positions shown · non-contrast
Comparison: none

PATIENT INFO:

Name:       ZAVOO FLEMING                Visit Date: 01/29/2018 [DATE]
PERFORMED BY:
DOZEMAN
SERVICE(S) PROVIDED:
INDICATIONS:
13 weeks gestation of pregnancy
FETAL EVALUATION:
Num Of Fetuses:     1
Preg. Location:     Mid
Fetal Heart         149
Rate(bpm):
Cardiac Activity:   Present
Presentation:       Variable
Placenta:           Anterior
P. Cord Insertion:  Normal
Amniotic Fluid
AFI FV:      Normal
Largest Pocket(cm)
3.0
BIOMETRY:
CRL:      75.6  mm     G. Age:  13w 5d                  EDD:   08/01/18
BPD:      23.6  mm     G. Age:  14w 0d
NFT:       1.4  mm
GESTATIONAL AGE:
U/S Today:     14w 0d                                        EDD:   07/30/18
Best:          13w 0d     Det. By:  Early Ultrasound         EDD:   08/06/18
(12/24/17)
ANATOMY:
Cranium:               Normal appearance      Bladder:                Seen
Face:                  Within Normal Limits   Upper Extremities:      Visualized
Stomach:               Seen                   Lower Extremities:      Visualized
CERVIX UTERUS ADNEXA:
Adnexa:       WNL

[Series 1: us mfm ob complete +14 wks · 0.22mm/px · 13 of 33 slices shown]
[im 2/33]
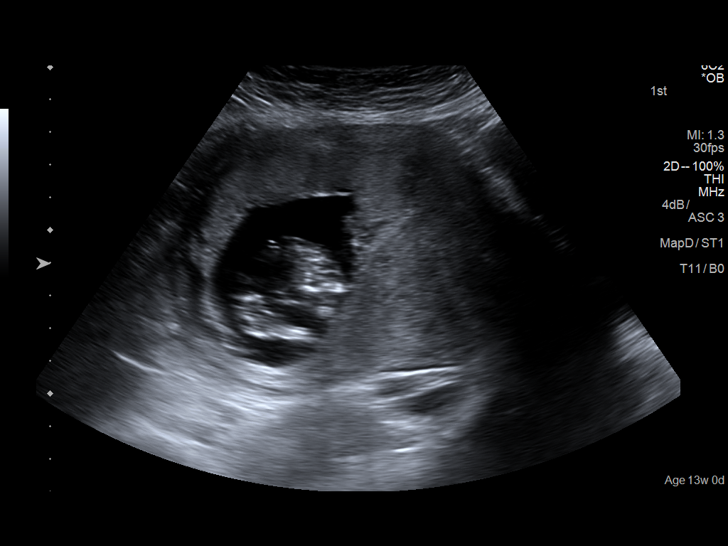
[im 4/33]
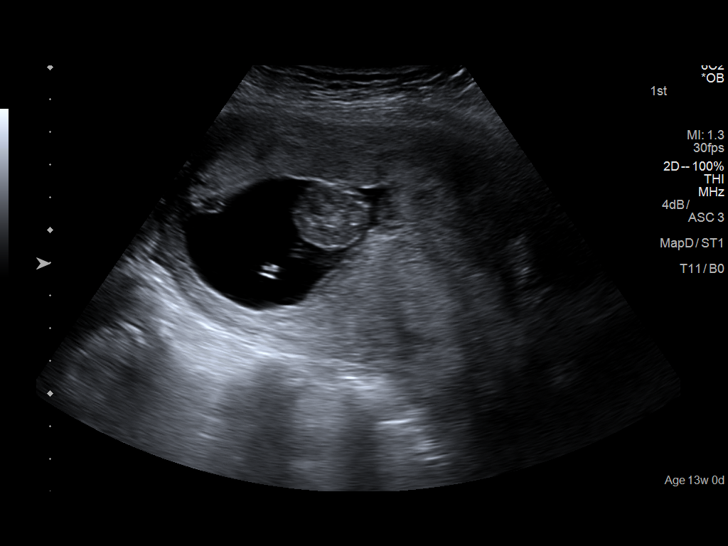
[im 6/33]
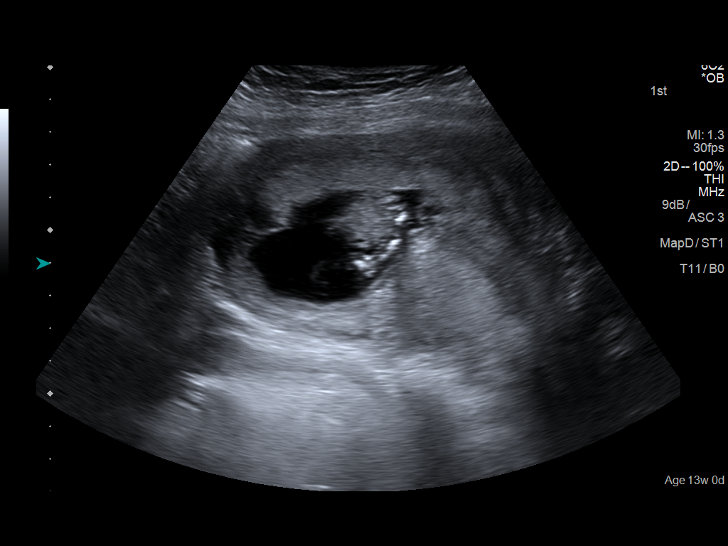
[im 9/33]
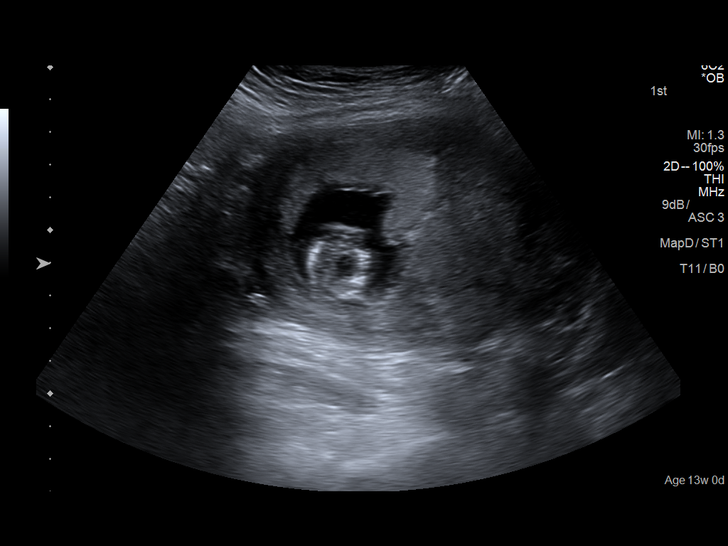
[im 11/33]
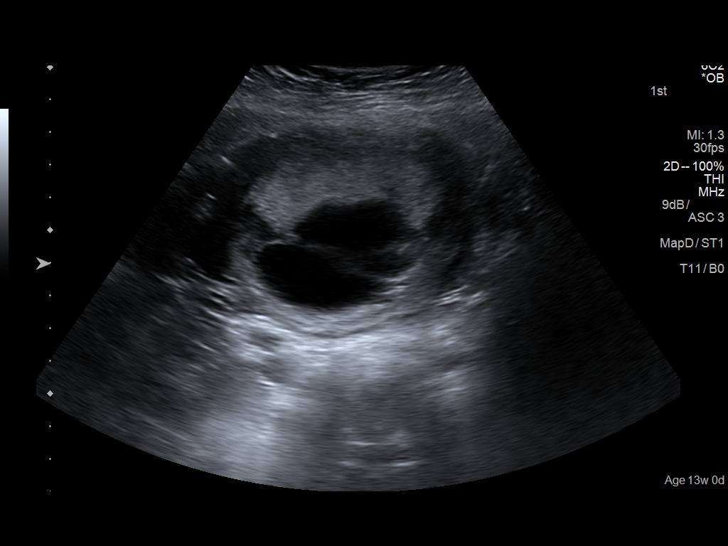
[im 14/33]
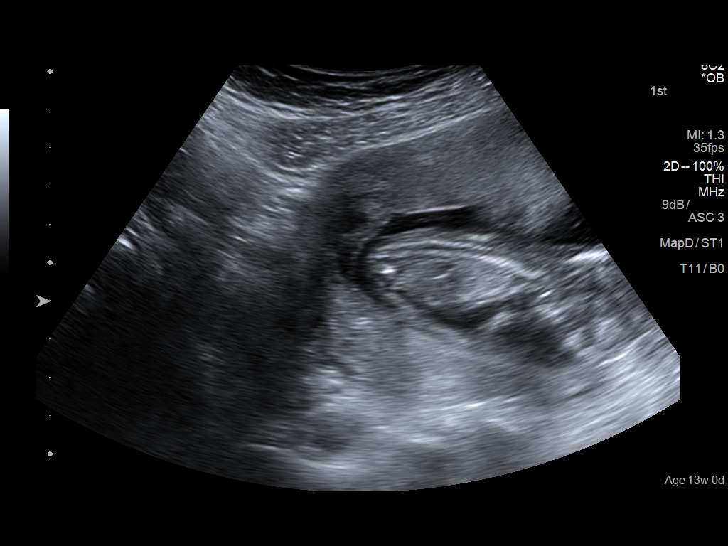
[im 17/33]
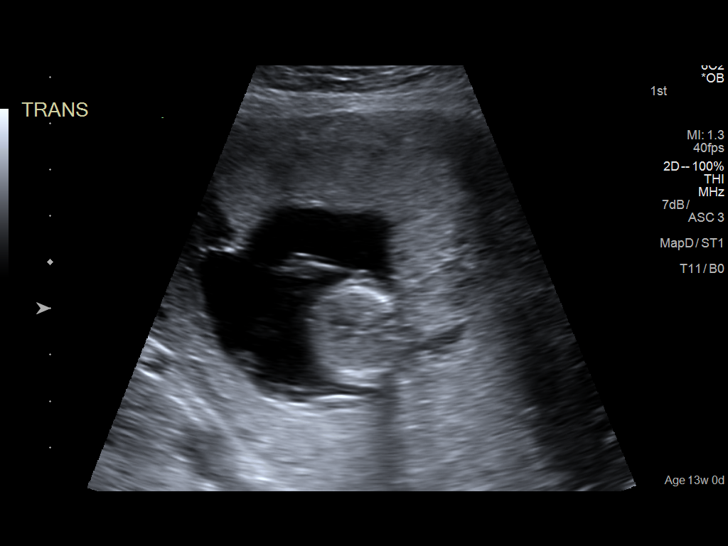
[im 19/33]
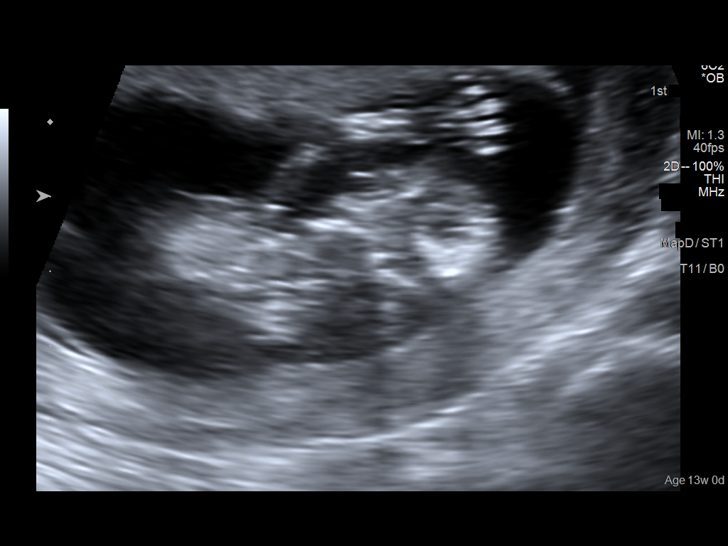
[im 22/33]
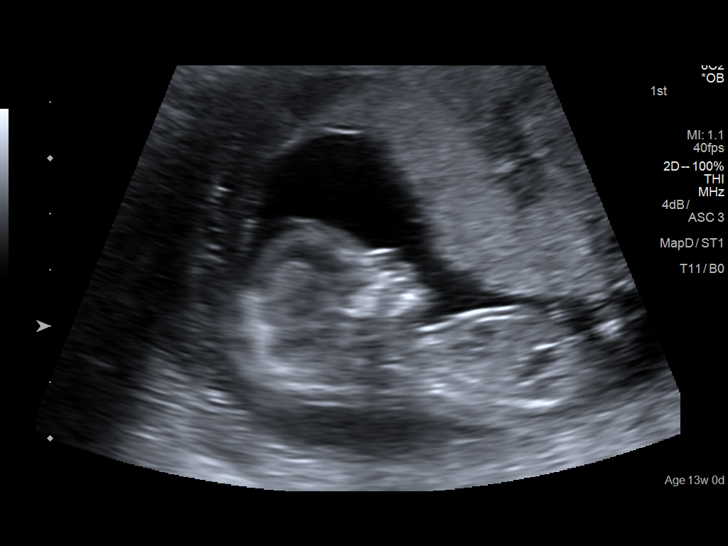
[im 24/33]
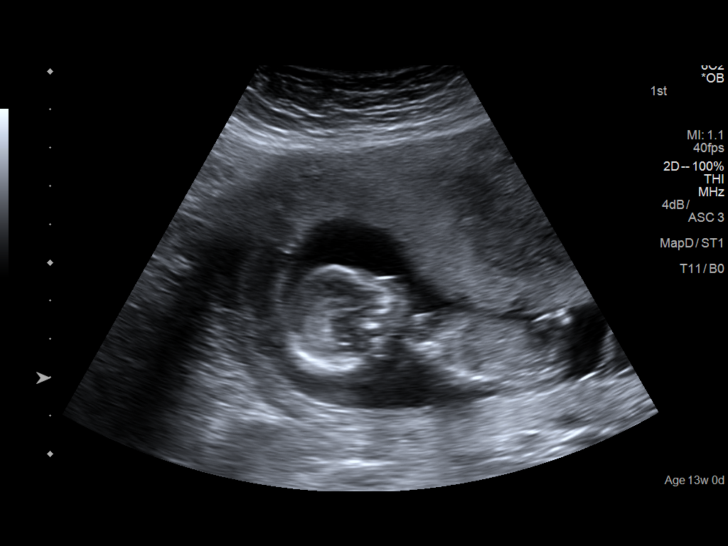
[im 27/33]
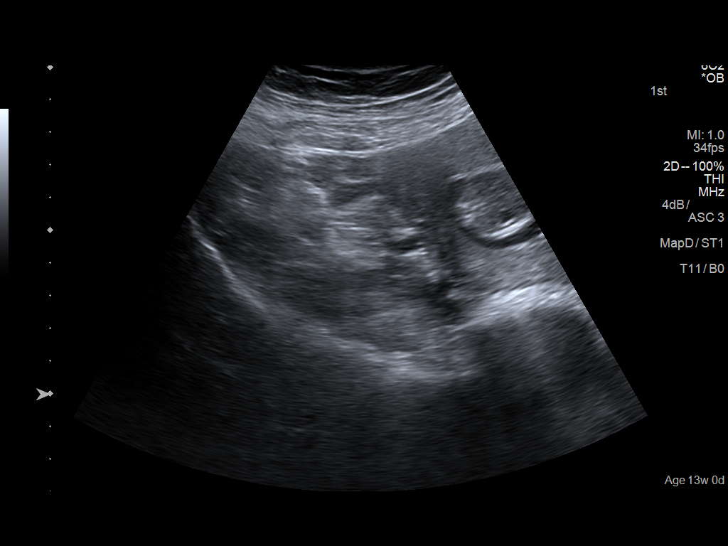
[im 29/33]
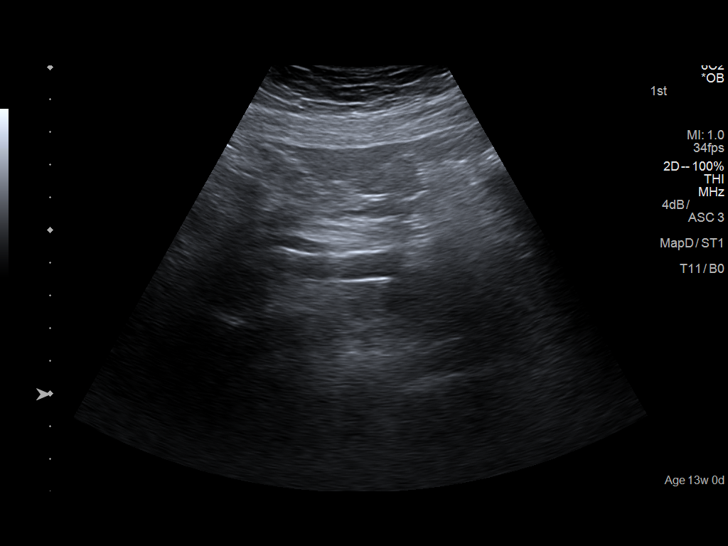
[im 31/33]
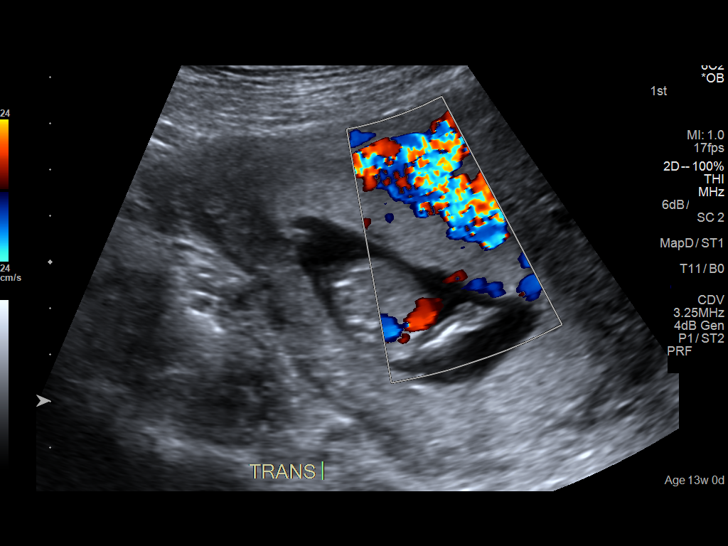

[13 of 28 positions shown; findings below may reference images not displayed]

IMPRESSION: Dear Ms. DOZEMAN,

Thank you for referring your patient to [facility] for  first
trimester evaluation.

A singleton gestation is noted at 69w3d gestation by 7w6d
ultrasound performed 12/24/2017 at [REDACTED].

The fetal biometry correlates with established dating.

The patient met with our genetic counselor and decided
against any genetic screening or invasive testing.

First trimester fetal anatomy appeared normal.

The nuchal translucency area appeared normal.

The ovaries were not visualized, but both adnexa appeared
normal.

Thank you for allowing us to participate in your patient's care.

assistance.

## 2020-10-06 ENCOUNTER — Ambulatory Visit
Admission: EM | Admit: 2020-10-06 | Discharge: 2020-10-06 | Disposition: A | Discharge status: Home / Self Care | Payer: Managed Care, Other (non HMO) | Attending: Emergency Medicine | Admitting: Emergency Medicine

## 2020-10-06 ENCOUNTER — Other Ambulatory Visit: Payer: Self-pay

## 2020-10-06 ENCOUNTER — Encounter: Payer: Self-pay | Admitting: Emergency Medicine

## 2020-10-06 DIAGNOSIS — F17210 Nicotine dependence, cigarettes, uncomplicated: Secondary | ICD-10-CM | POA: Insufficient documentation

## 2020-10-06 DIAGNOSIS — R051 Acute cough: Secondary | ICD-10-CM | POA: Insufficient documentation

## 2020-10-06 DIAGNOSIS — Z20822 Contact with and (suspected) exposure to covid-19: Secondary | ICD-10-CM | POA: Diagnosis not present

## 2020-10-06 DIAGNOSIS — R059 Cough, unspecified: Secondary | ICD-10-CM

## 2020-10-06 DIAGNOSIS — J069 Acute upper respiratory infection, unspecified: Secondary | ICD-10-CM | POA: Diagnosis not present

## 2020-10-06 DIAGNOSIS — J029 Acute pharyngitis, unspecified: Secondary | ICD-10-CM

## 2020-10-06 LAB — RESP PANEL BY RT-PCR (FLU A&B, COVID) ARPGX2
Influenza A by PCR: NEGATIVE
Influenza B by PCR: NEGATIVE
SARS Coronavirus 2 by RT PCR: NEGATIVE

## 2020-10-06 LAB — GROUP A STREP BY PCR: Group A Strep by PCR: NOT DETECTED

## 2020-10-06 MED ORDER — PSEUDOEPH-BROMPHEN-DM 30-2-10 MG/5ML PO SYRP: 10.0000 mL | ORAL_SOLUTION | Freq: Four times a day (QID) | ORAL | 0 refills | Status: AC | PRN

## 2020-10-06 NOTE — Discharge Instructions (Addendum)

## 2020-10-06 NOTE — ED Provider Notes (Signed)
[facility]    CSN: [id number] Arrival date & time: 10/06/20  1736      History   Chief Complaint Chief Complaint  Patient presents with  . Sore Throat  . Nasal Congestion  . Cough    HPI Rebecca Spencer is a 39 y.o. female presenting for 2-day history of dry cough, sore throat, and nasal congestion.  She denies any associated fever, fatigue, body aches, headaches, sinus pain, ear pain, chest pain, shortness of breath or breathing difficulty, abdominal pain, nausea/vomiting/diarrhea, or changes in smell and taste.  Patient denies known COVID-19 exposure and has not been vaccinated for Covid.  Has taken over-the-counter cough/cold medication.  Patient denies any history of cardiopulmonary disease.  She has no other complaints or concerns today.  HPI  History reviewed. No pertinent past medical history.  Patient Active Problem List   Diagnosis Date Noted  . Supervision of high risk pregnancy in third trimester 07/30/2018  . Advanced maternal age in multigravida, first trimester     Past Surgical History:  Procedure Laterality Date  . GALLBLADDER SURGERY    . TUBAL LIGATION Bilateral 07/31/2018   Procedure: POST PARTUM TUBAL LIGATION;  Surgeon: Ward, Elenora Fender, MD;  Location: [facility] ORS;  Service: Gynecology;  Laterality: Bilateral;    OB History    Gravida  4   Para  4   Term  4   Preterm      AB      Living  1     SAB      IAB      Ectopic      Multiple  0   Live Births  1            Home Medications    Prior to Admission medications   Medication Sig Start Date End Date Taking? Authorizing Provider  brompheniramine-pseudoephedrine-DM 30-2-10 MG/5ML syrup Take 10 mLs by mouth 4 (four) times daily as needed for up to 7 days. 10/06/20 10/13/20  Shirlee Latch, PA-C    Family History Family History  Problem Relation Age of Onset  . Cancer Maternal Grandmother   . Heart disease Maternal Grandfather   . Cancer Paternal Grandmother    . Heart disease Paternal Grandfather     Social History Social History   Tobacco Use  . Smoking status: Heavy Tobacco Smoker    Packs/day: 0.50    Types: Cigarettes  . Smokeless tobacco: Never Used  Vaping Use  . Vaping Use: Never used  Substance Use Topics  . Alcohol use: Never  . Drug use: Never     Allergies   Latex   Review of Systems Review of Systems  Constitutional: Negative for chills, diaphoresis, fatigue and fever.  HENT: Positive for congestion, rhinorrhea and sore throat. Negative for ear pain, sinus pressure and sinus pain.   Respiratory: Positive for cough. Negative for shortness of breath.   Gastrointestinal: Negative for abdominal pain, nausea and vomiting.  Musculoskeletal: Negative for arthralgias and myalgias.  Skin: Negative for rash.  Neurological: Negative for weakness and headaches.  Hematological: Negative for adenopathy.     Physical Exam Triage Vital Signs ED Triage Vitals [10/06/20 1750]  Enc Vitals Group     BP      Pulse      Resp      Temp      Temp src      SpO2      Weight      Height  Head Circumference      Peak Flow      Pain Score 6     Pain Loc      Pain Edu?      Excl. in GC?    No data found.  Updated Vital Signs BP (!) 142/97 (BP Location: Left Arm)   Pulse (!) 105   Temp 98.5 F (36.9 C) (Oral)   Resp 17   LMP 10/05/2020   SpO2 98%      Physical Exam Vitals and nursing note reviewed.  Constitutional:      General: She is not in acute distress.    Appearance: Normal appearance. She is not ill-appearing or toxic-appearing.  HENT:     Head: Normocephalic and atraumatic.     Nose: Congestion and rhinorrhea (trace clear drainage) present.     Mouth/Throat:     Mouth: Mucous membranes are moist.     Pharynx: Oropharynx is clear. Posterior oropharyngeal erythema (mild) present.  Eyes:     General: No scleral icterus.       Right eye: No discharge.        Left eye: No discharge.      Conjunctiva/sclera: Conjunctivae normal.  Cardiovascular:     Rate and Rhythm: Regular rhythm. Tachycardia present.     Heart sounds: Normal heart sounds.  Pulmonary:     Effort: Pulmonary effort is normal. No respiratory distress.     Breath sounds: Normal breath sounds.  Musculoskeletal:     Cervical back: Neck supple.  Skin:    General: Skin is dry.  Neurological:     General: No focal deficit present.     Mental Status: She is alert. Mental status is at baseline.     Motor: No weakness.     Gait: Gait normal.  Psychiatric:        Mood and Affect: Mood normal.        Behavior: Behavior normal.        Thought Content: Thought content normal.      UC Treatments / Results  Labs (all labs ordered are listed, but only abnormal results are displayed) Labs Reviewed  RESP PANEL BY RT-PCR (FLU A&B, COVID) ARPGX2  GROUP A STREP BY PCR    EKG   Radiology No results found.  Procedures Procedures (including critical care time)  Medications Ordered in UC Medications - No data to display  Initial Impression / Assessment and Plan / UC Course  I have reviewed the triage vital signs and the nursing notes.  Pertinent labs & imaging results that were available during my care of the patient were reviewed by me and considered in my medical decision making (see chart for details).   Respiratory panel and molecular strep test negative.  Advised supportive care with increasing rest and fluids.  Sent Bromfed cough syrup to help with symptoms.  Advised to follow-up as needed or for any new or worsening symptoms.  ED precautions reviewed.  Final Clinical Impressions(s) / UC Diagnoses   Final diagnoses:  Upper respiratory tract infection, unspecified type  Cough  Sore throat     Discharge Instructions     URI/COLD SYMPTOMS: Your exam today is consistent with a viral illness. Antibiotics are not indicated at this time. Use medications as directed, including cough syrup, nasal  saline, and decongestants. Your symptoms should improve over the next few days and resolve within 7-10 days. Increase rest and fluids. F/u if symptoms worsen or predominate such as sore throat, ear  pain, productive cough, shortness of breath, or if you develop high fevers or worsening fatigue over the next several days.    You have received COVID testing today either for positive exposure, concerning symptoms that could be related to COVID infection, screening purposes, or re-testing after confirmed positive.  Your test obtained today checks for active viral infection in the last 1-2 weeks. If your test is negative now, you can still test positive later. So, if you do develop symptoms you should either get re-tested and/or isolate x 10 days. Please follow CDC guidelines.  While Rapid antigen tests come back in 15-20 minutes, send out PCR/molecular test results typically come back within 24 hours. In the mean time, if you are symptomatic, assume this could be a positive test and treat/monitor yourself as if you do have COVID.   We will call with test results. Please download the MyChart app and set up a profile to access test results.   If symptomatic, go home and rest. Push fluids. Take Tylenol as needed for discomfort. Gargle warm salt water. Throat lozenges. Take Mucinex DM or Robitussin for cough. Humidifier in bedroom to ease coughing. Warm showers. Also review the COVID handout for more information.  COVID-19 INFECTION: The incubation period of COVID-19 is approximately 14 days after exposure, with most symptoms developing in roughly 4-5 days. Symptoms may range in severity from mild to critically severe. Roughly 80% of those infected will have mild symptoms. People of any age may become infected with COVID-19 and have the ability to transmit the virus. The most common symptoms include: fever, fatigue, cough, body aches, headaches, sore throat, nasal congestion, shortness of breath, nausea, vomiting,  diarrhea, changes in smell and/or taste.    COURSE OF ILLNESS Some patients may begin with mild disease which can progress quickly into critical symptoms. If your symptoms are worsening please call ahead to the Emergency Department and proceed there for further treatment. Recovery time appears to be roughly 1-2 weeks for mild symptoms and 3-6 weeks for severe disease.   GO IMMEDIATELY TO ER FOR FEVER YOU ARE UNABLE TO GET DOWN WITH TYLENOL, BREATHING PROBLEMS, CHEST PAIN, FATIGUE, LETHARGY, INABILITY TO EAT OR DRINK, ETC  QUARANTINE AND ISOLATION: To help decrease the spread of COVID-19 please remain isolated if you have COVID infection or are highly suspected to have COVID infection. This means -stay home and isolate to one room in the home if you live with others. Do not share a bed or bathroom with others while ill, sanitize and wipe down all countertops and keep common areas clean and disinfected. You may discontinue isolation if you have a mild case and are asymptomatic 10 days after symptom onset as long as you have been fever free >24 hours without having to take Motrin or Tylenol. If your case is more severe (meaning you develop pneumonia or are admitted in the hospital), you may have to isolate longer.   If you have been in close contact (within 6 feet) of someone diagnosed with COVID 19, you are advised to quarantine in your home for 14 days as symptoms can develop anywhere from 2-14 days after exposure to the virus. If you develop symptoms, you  must isolate.  Most current guidelines for COVID after exposure -isolate 10 days if you ARE NOT tested for COVID as long as symptoms do not develop -isolate 7 days if you are tested and remain asymptomatic -You do not necessarily need to be tested for COVID if you have +  exposure and        develop   symptoms. Just isolate at home x10 days from symptom onset During this global pandemic, CDC advises to practice social distancing, try to stay at least  16ft away from others at all times. Wear a face covering. Wash and sanitize your hands regularly and avoid going anywhere that is not necessary.  KEEP IN MIND THAT THE COVID TEST IS NOT 100% ACCURATE AND YOU SHOULD STILL DO EVERYTHING TO PREVENT POTENTIAL SPREAD OF VIRUS TO OTHERS (WEAR MASK, WEAR GLOVES, WASH HANDS AND SANITIZE REGULARLY). IF INITIAL TEST IS NEGATIVE, THIS MAY NOT MEAN YOU ARE DEFINITELY NEGATIVE. MOST ACCURATE TESTING IS DONE 5-7 DAYS AFTER EXPOSURE.   It is not advised by CDC to get re-tested after receiving a positive COVID test since you can still test positive for weeks to months after you have already cleared the virus.   *If you have not been vaccinated for COVID, I strongly suggest you consider getting vaccinated as long as there are no contraindications.      ED Prescriptions    Medication Sig Dispense Auth. Provider   brompheniramine-pseudoephedrine-DM 30-2-10 MG/5ML syrup Take 10 mLs by mouth 4 (four) times daily as needed for up to 7 days. 150 mL Shirlee Latch, PA-C     PDMP not reviewed this encounter.   Shirlee Latch, PA-C 10/07/20 [phone]

## 2020-10-06 NOTE — ED Triage Notes (Signed)
Pt c/o dry cough, sore throat, and nasal congestion x 2 days. Pt denies fever body aches or chills. Pt has been taking ibuprofen with some relief.

## 2021-06-18 ENCOUNTER — Other Ambulatory Visit: Payer: Self-pay

## 2021-06-18 ENCOUNTER — Ambulatory Visit
Admission: EM | Admit: 2021-06-18 | Discharge: 2021-06-18 | Disposition: A | Discharge status: Home / Self Care | Payer: Managed Care, Other (non HMO) | Attending: Emergency Medicine | Admitting: Emergency Medicine

## 2021-06-18 DIAGNOSIS — M26621 Arthralgia of right temporomandibular joint: Secondary | ICD-10-CM | POA: Insufficient documentation

## 2021-06-18 DIAGNOSIS — Z20822 Contact with and (suspected) exposure to covid-19: Secondary | ICD-10-CM | POA: Diagnosis not present

## 2021-06-18 DIAGNOSIS — H6981 Other specified disorders of Eustachian tube, right ear: Secondary | ICD-10-CM | POA: Diagnosis not present

## 2021-06-18 MED ORDER — IBUPROFEN 600 MG PO TABS: 600.0000 mg | ORAL_TABLET | Freq: Four times a day (QID) | ORAL | 0 refills | Status: DC | PRN

## 2021-06-18 MED ORDER — FLUTICASONE PROPIONATE 50 MCG/ACT NA SUSP: 2.0000 | Freq: Every day | NASAL | 0 refills | Status: DC

## 2021-06-18 MED ORDER — CYCLOBENZAPRINE HCL 10 MG PO TABS: 10.0000 mg | ORAL_TABLET | Freq: Every day | ORAL | 0 refills | Status: DC

## 2021-06-18 NOTE — ED Provider Notes (Signed)
HPI  SUBJECTIVE:  Rebecca Spencer is a 40 y.o. female who presents with right ear pain starting yesterday.  She states that her ear "pops" when she swallows.  She reports decreased hearing, nasal congestion, rhinorrhea, postnasal drip.  She reports body aches and fatigue yesterday and a cough secondary to postnasal drip.  No fevers, allergy symptoms.  No recent swimming.  She does not grind her teeth.  No headaches, sore throat, loss of sense of smell or taste, tinnitus, vertigo, shortness of breath, nausea, vomiting, diarrhea, abdominal pain.  No known COVID exposure.  She did not get the COVID-vaccine.  No antibiotics in the past month.  No antipyretic in the past 6 hours.  She has tried ibuprofen and Mucinex.  The ibuprofen helps.  Symptoms are worse with swallowing.  She has no past medical history.  LMP: Now.  Denies the possibility being pregnant.  PMD: None.  History reviewed. No pertinent past medical history.  Past Surgical History:  Procedure Laterality Date   GALLBLADDER SURGERY     TUBAL LIGATION Bilateral 07/31/2018   Procedure: POST PARTUM TUBAL LIGATION;  Surgeon: Ward, Elenora Fender, MD;  Location: [facility] OR[facility];  Service: Gynecology;  Laterality: Bilateral;    Family History  Problem Relation Age of Onset   Cancer Maternal Grandmother    Heart disease Maternal Grandfather    Cancer Paternal Grandmother    Heart disease Paternal Grandfather     Social History   Tobacco Use   Smoking status: Heavy Smoker    Packs/day: 0.50    Types: Cigarettes   Smokeless tobacco: Never  Vaping Use   Vaping Use: Never used  Substance Use Topics   Alcohol use: Never   Drug use: Never    No current facility-administered medications for this encounter.  Current Outpatient Medications:    cyclobenzaprine (FLEXERIL) 10 MG tablet, Take 1 tablet (10 mg total) by mouth at bedtime., Disp: 20 tablet, Rfl: 0   fluticasone (FLONASE) 50 MCG/ACT nasal spray, Place 2 sprays into both nostrils daily.,  Disp: 16 g, Rfl: 0   ibuprofen (ADVIL) 600 MG tablet, Take 1 tablet (600 mg total) by mouth every 6 (six) hours as needed., Disp: 30 tablet, Rfl: 0  Allergies  Allergen Reactions   Latex Rash     ROS  As noted in HPI.   Physical Exam  BP (!) 136/96 (BP Location: Left Arm)   Pulse 84   Temp 98.5 F (36.9 C) (Oral)   Resp 16   SpO2 99%   Constitutional: Well developed, well nourished, no acute distress Eyes:  EOMI, conjunctiva normal bilaterally HENT: Normocephalic, atraumatic,mucus membranes moist.  Bilateral external ear, EACs, TMs normal.  No pain with traction on pinna, palpation of mastoid right ear.  Pain with palpation of tragus right ear.  Tenderness at the right TMJ.  No crepitus. Respiratory: Normal inspiratory effort Cardiovascular: Normal rate GI: nondistended skin: No rash, skin intact Musculoskeletal: no deformities Neurologic: Alert & oriented x 3, no focal neuro deficits Psychiatric: Speech and behavior appropriate   ED Course   Medications - No data to display  Orders Placed This Encounter  Procedures   SARS CORONAVIRUS 2 (TAT 6-24 HRS) Nasopharyngeal Nasopharyngeal Swab    Standing Status:   Standing    Number of Occurrences:   1    Order Specific Question:   Is this test for diagnosis or screening    Answer:   Diagnosis of ill patient    Order Specific Question:  Symptomatic for COVID-19 as defined by CDC    Answer:   Yes    Order Specific Question:   Date of Symptom Onset    Answer:   06/17/2021    Order Specific Question:   Hospitalized for COVID-19    Answer:   Yes    Order Specific Question:   Admitted to ICU for COVID-19    Answer:   No    Order Specific Question:   Previously tested for COVID-19    Answer:   Yes    Order Specific Question:   Resident in a congregate (group) care setting    Answer:   Unknown    Order Specific Question:   Employed in healthcare setting    Answer:   Unknown    Order Specific Question:   Pregnant     Answer:   Unknown    Order Specific Question:   Has patient completed COVID vaccination(s) (2 doses of Pfizer/Moderna 1 dose of [employer])    Answer:   No    No results found for this or any previous visit (from the past 24 hour(s)). No results found.  ED Clinical Impression  1. Dysfunction of right eustachian tube   2. Arthralgia of right temporomandibular joint   3. Encounter for laboratory testing for COVID-19 virus      ED Assessment/Plan    Presentation most consistent with eustachian tube dysfunction and TMJ arthralgia.  Will send home with Flexeril, Tylenol/ibuprofen, soft diet for the next several days for the TMJ arthralgia.  She will need to follow-up with her dentist if it persists beyond a week.  She also has eustachian tube dysfunction. will try Flonase, and antihistamine/decongestant combination of her choice.  She will follow-up with ENT if the symptoms persist.  Also providing primary care list for ongoing care and order assistance in finding a PMD.  COVID pending at the time of signing of this note. She will be a candidate for antivirals because she is not vaccinated.  She has opted for Molnupiravir.  Does not want to get blood work today.  Discussed labs, MDM, treatment plan, and plan for follow-up with patient.  patient agrees with plan.   Meds ordered this encounter  Medications   cyclobenzaprine (FLEXERIL) 10 MG tablet    Sig: Take 1 tablet (10 mg total) by mouth at bedtime.    Dispense:  20 tablet    Refill:  0   fluticasone (FLONASE) 50 MCG/ACT nasal spray    Sig: Place 2 sprays into both nostrils daily.    Dispense:  16 g    Refill:  0   ibuprofen (ADVIL) 600 MG tablet    Sig: Take 1 tablet (600 mg total) by mouth every 6 (six) hours as needed.    Dispense:  30 tablet    Refill:  0      *This clinic note was created using Scientist, clinical (histocompatibility and immunogenetics). Therefore, there may be occasional mistakes despite careful proofreading.  ?   Domenick Gong, MD 06/18/21 [phone]

## 2021-06-18 NOTE — ED Triage Notes (Signed)
Patient presents to Urgent Care with complaints of nasal congestion and right ear pain since yesterday. Treating pain with ibuprofen.   Denies fever.

## 2021-06-18 NOTE — Discharge Instructions (Addendum)
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  Follow-up with your dentist as needed.  Flonase, and antihistamine/decongestant combination of your choice such as Claritin-D, Allegra-D, or Zyrtec-D.  Follow-up with Dr. Willeen Cass if your symptoms persist after a week.  Here is a list of primary care providers who are taking new patients:  Dr. Elizabeth Sauer [address] [place] [address] [phone]  [facility] Primary Care at [facility] [address] [place], [place] [address] [phone]  [facility] Primary [facility] [place] [address] [place] [address]  [phone]  [facility] [address] [place], [place] [address] [phone]  [facility] [address] [place]  [phone] [place], [place] [address]  Here are clinics/ other resources who will see you if you do not have insurance. Some have certain criteria that you must meet. Call them and find out what they are:  [facility]: [address] [place], [place] [address] Phone: [phone] Hours: First and Third Saturdays of each Month, 9 a.m. - 1 p.m.  [facility]: [address], [address] [place], [place] [address] Phone: [phone] Hours: Tuesday, 4 p.m. - 8 p.m. Thursday, 1 p.m. - 8 p.m. Wednesday, 9 a.m. - [facility] [address] [place], [place] [address] Phone: [phone] Pharmacy Phone Number: [phone] Dental Phone Number: [phone] [facility] Insurance Help: [phone]  Dental Hours: Monday - Thursday, 8 a.m. - 6 p.m.  [facility] [address] [place], [place] [address] Phone: [phone] Pharmacy Phone Number: [phone] [facility] Insurance Help: [phone]  [facility] [address] [place], [place] [address] Phone: [phone] Pharmacy Phone Number: [phone] [facility]  Insurance Help: [phone]  [facility] [address] [place], [place] [address] Phone: [phone] [facility] Insurance Help: [phone]   [facility] [address], [place], [place] [address] Phone: [phone]  Go to www.goodrx.com  or www.costplusdrugs.com to look up your medications. This will give you a list of where you can find your prescriptions at the most affordable prices. Or ask the pharmacist what the cash price is, or if they have any other discount programs available to help make your medication more affordable. This can be less expensive than what you would pay with insurance.

## 2021-06-19 LAB — SARS CORONAVIRUS 2 (TAT 6-24 HRS): SARS Coronavirus 2: POSITIVE — AB

## 2021-10-17 ENCOUNTER — Other Ambulatory Visit: Payer: Self-pay

## 2021-10-17 ENCOUNTER — Ambulatory Visit (INDEPENDENT_AMBULATORY_CARE_PROVIDER_SITE_OTHER): Payer: Managed Care, Other (non HMO)

## 2021-10-17 ENCOUNTER — Ambulatory Visit
Admission: EM | Admit: 2021-10-17 | Discharge: 2021-10-17 | Disposition: A | Discharge status: Home / Self Care | Payer: Managed Care, Other (non HMO) | Attending: Internal Medicine | Admitting: Internal Medicine

## 2021-10-17 DIAGNOSIS — R093 Abnormal sputum: Secondary | ICD-10-CM | POA: Diagnosis not present

## 2021-10-17 DIAGNOSIS — J209 Acute bronchitis, unspecified: Secondary | ICD-10-CM

## 2021-10-17 DIAGNOSIS — R059 Cough, unspecified: Secondary | ICD-10-CM

## 2021-10-17 MED ORDER — BENZONATATE 100 MG PO CAPS: 100.0000 mg | ORAL_CAPSULE | Freq: Three times a day (TID) | ORAL | 0 refills | Status: DC | PRN

## 2021-10-17 MED ORDER — IBUPROFEN 600 MG PO TABS: 600.0000 mg | ORAL_TABLET | Freq: Four times a day (QID) | ORAL | 0 refills | Status: DC | PRN

## 2021-10-17 MED ORDER — ALBUTEROL SULFATE HFA 108 (90 BASE) MCG/ACT IN AERS: 1.0000 | INHALATION_SPRAY | Freq: Four times a day (QID) | RESPIRATORY_TRACT | 0 refills | Status: DC | PRN

## 2021-10-17 MED ORDER — PREDNISONE 20 MG PO TABS: 20.0000 mg | ORAL_TABLET | Freq: Every day | ORAL | 0 refills | Status: AC

## 2021-10-17 NOTE — ED Triage Notes (Signed)
Pt here with C/O chest congestion since Monday. Cough, chest congestion. Denies fever or body aches, does state that upper back hurts with taking deep breath.

## 2021-10-17 NOTE — Discharge Instructions (Signed)
Please take medications as prescribed Smoke cessation will help with symptoms Maintain adequate hydration Tylenol/Motrin as needed for chest pain Return to urgent care if you have any other concerns.

## 2021-10-17 NOTE — ED Provider Notes (Signed)
[facility]    CSN: [id number] Arrival date & time: 10/17/21  1207      History   Chief Complaint Chief Complaint  Patient presents with   Cough   Nasal Congestion    HPI Rebecca Spencer is a 40 y.o. female with a history of history of smoking comes to urgent care with 3-day history of cough, chest tightness and wheezing.  Symptoms started insidiously and has been persistent.  Cough is productive of greenish sputum.  Patient has chills but denies any fever.  No nausea vomiting or diarrhea.  She has chest pain with cough.  No calf pain.  No long distance travel.  No calf tenderness.  No sick contacts.  Patient is not vaccinated against COVID. HPI  History reviewed. No pertinent past medical history.  Patient Active Problem List   Diagnosis Date Noted   Supervision of high risk pregnancy in third trimester 07/30/2018   Advanced maternal age in multigravida, first trimester     Past Surgical History:  Procedure Laterality Date   GALLBLADDER SURGERY     TUBAL LIGATION Bilateral 07/31/2018   Procedure: POST PARTUM TUBAL LIGATION;  Surgeon: Ward, Elenora Fender, MD;  Location: [facility] ORS;  Service: Gynecology;  Laterality: Bilateral;    OB History     Gravida  4   Para  4   Term  4   Preterm      AB      Living  1      SAB      IAB      Ectopic      Multiple  0   Live Births  1            Home Medications    Prior to Admission medications   Medication Sig Start Date End Date Taking? Authorizing Provider  albuterol (VENTOLIN HFA) 108 (90 Base) MCG/ACT inhaler Inhale 1 puff into the lungs every 6 (six) hours as needed for wheezing or shortness of breath. 10/17/21  Yes Nahiara Kretzschmar, Britta Mccreedy, MD  benzonatate (TESSALON) 100 MG capsule Take 1 capsule (100 mg total) by mouth 3 (three) times daily as needed for cough. 10/17/21  Yes Tamie Minteer, Britta Mccreedy, MD  predniSONE (DELTASONE) 20 MG tablet Take 1 tablet (20 mg total) by mouth daily for 5 days. 10/17/21  10/22/21 Yes Aluel Schwarz, Britta Mccreedy, MD  ibuprofen (ADVIL) 600 MG tablet Take 1 tablet (600 mg total) by mouth every 6 (six) hours as needed. 10/17/21   Meher Kucinski, Britta Mccreedy, MD    Family History Family History  Problem Relation Age of Onset   Cancer Maternal Grandmother    Heart disease Maternal Grandfather    Cancer Paternal Grandmother    Heart disease Paternal Grandfather     Social History Social History   Tobacco Use   Smoking status: Every Day    Packs/day: 0.50    Types: Cigarettes   Smokeless tobacco: Never  Vaping Use   Vaping Use: Never used  Substance Use Topics   Alcohol use: Never   Drug use: Never     Allergies   Latex   Review of Systems Review of Systems  Constitutional:  Positive for chills and fatigue. Negative for fever.  HENT: Negative.    Respiratory:  Positive for cough, chest tightness, shortness of breath and wheezing.   Cardiovascular:  Positive for chest pain.  Neurological: Negative.     Physical Exam Triage Vital Signs ED Triage Vitals  Enc Vitals Group  BP 10/17/21 1242 (!) 143/99     Pulse Rate 10/17/21 1242 (!) 107     Resp 10/17/21 1242 18     Temp 10/17/21 1242 97.6 F (36.4 C)     Temp src --      SpO2 10/17/21 1242 100 %     Weight 10/17/21 1241 175 lb (79.4 kg)     Height 10/17/21 1241 5\' 4"  (1.626 m)     Head Circumference --      Peak Flow --      Pain Score 10/17/21 1239 0     Pain Loc --      Pain Edu? --      Excl. in GC? --    No data found.  Updated Vital Signs BP (!) 143/99 (BP Location: Left Arm)    Pulse (!) 107    Temp 97.6 F (36.4 C)    Resp 18    Ht 5\' 4"  (1.626 m)    Wt 79.4 kg    LMP 09/20/2021    SpO2 100%    BMI 30.04 kg/m   Visual Acuity Right Eye Distance:   Left Eye Distance:   Bilateral Distance:    Right Eye Near:   Left Eye Near:    Bilateral Near:     Physical Exam Vitals and nursing note reviewed.  Constitutional:      General: She is not in acute distress.    Appearance: She is  ill-appearing.  Cardiovascular:     Rate and Rhythm: Normal rate and regular rhythm.     Pulses: Normal pulses.     Heart sounds: Normal heart sounds.  Pulmonary:     Effort: Pulmonary effort is normal. No respiratory distress.     Breath sounds: No wheezing or rhonchi.  Abdominal:     General: Bowel sounds are normal.     Palpations: Abdomen is soft.  Neurological:     Mental Status: She is alert.     UC Treatments / Results  Labs (all labs ordered are listed, but only abnormal results are displayed) Labs Reviewed - No data to display  EKG   Radiology DG Chest 2 View  Result Date: 10/17/2021 CLINICAL DATA:  Cough with green sputum. Chest congestion for 2 days. EXAM: CHEST - 2 VIEW COMPARISON:  Radiographs 01/08/2010. FINDINGS: The heart size and mediastinal contours are normal. The lungs are clear. There is no pleural effusion or pneumothorax. No acute osseous findings are identified. IMPRESSION: Stable chest.  No active cardiopulmonary process. Electronically Signed   By: 10/19/2021 M.D.   On: 10/17/2021 13:32    Procedures Procedures (including critical care time)  Medications Ordered in UC Medications - No data to display  Initial Impression / Assessment and Plan / UC Course  I have reviewed the triage vital signs and the nursing notes.  Pertinent labs & imaging results that were available during my care of the patient were reviewed by me and considered in my medical decision making (see chart for details).     1.  Acute bronchitis with bronchospasm: Chest x-ray is negative for acute lung infiltrate Smoke cessation counseling given Time spent on smoke cessation is less than 10 minutes Albuterol inhaler Tessalon Perles as needed for cough Prednisone 20 mg orally daily for 5 days Maintain adequate hydration Return to urgent care if symptoms worsen. Final Clinical Impressions(s) / UC Diagnoses   Final diagnoses:  Acute bronchitis with bronchospasm      Discharge Instructions  Please take medications as prescribed Smoke cessation will help with symptoms Maintain adequate hydration Tylenol/Motrin as needed for chest pain Return to urgent care if you have any other concerns.   ED Prescriptions     Medication Sig Dispense Auth. Provider   ibuprofen (ADVIL) 600 MG tablet Take 1 tablet (600 mg total) by mouth every 6 (six) hours as needed. 30 tablet Katrinna Travieso, Britta Mccreedy, MD   albuterol (VENTOLIN HFA) 108 (90 Base) MCG/ACT inhaler Inhale 1 puff into the lungs every 6 (six) hours as needed for wheezing or shortness of breath. 18 g Braxten Memmer, Britta Mccreedy, MD   benzonatate (TESSALON) 100 MG capsule Take 1 capsule (100 mg total) by mouth 3 (three) times daily as needed for cough. 21 capsule Pascuala Klutts, Britta Mccreedy, MD   predniSONE (DELTASONE) 20 MG tablet Take 1 tablet (20 mg total) by mouth daily for 5 days. 5 tablet Bawi Lakins, Britta Mccreedy, MD      PDMP not reviewed this encounter.   Merrilee Jansky, MD 10/17/21 1409

## 2022-04-17 ENCOUNTER — Ambulatory Visit
Admission: EM | Admit: 2022-04-17 | Discharge: 2022-04-17 | Disposition: A | Discharge status: Home / Self Care | Payer: BC Managed Care – PPO | Attending: Family | Admitting: Family

## 2022-04-17 ENCOUNTER — Encounter: Payer: Self-pay | Admitting: Emergency Medicine

## 2022-04-17 DIAGNOSIS — R051 Acute cough: Secondary | ICD-10-CM | POA: Insufficient documentation

## 2022-04-17 DIAGNOSIS — R0789 Other chest pain: Secondary | ICD-10-CM | POA: Insufficient documentation

## 2022-04-17 DIAGNOSIS — J040 Acute laryngitis: Secondary | ICD-10-CM | POA: Diagnosis present

## 2022-04-17 DIAGNOSIS — F17210 Nicotine dependence, cigarettes, uncomplicated: Secondary | ICD-10-CM | POA: Diagnosis not present

## 2022-04-17 DIAGNOSIS — Z20822 Contact with and (suspected) exposure to covid-19: Secondary | ICD-10-CM | POA: Diagnosis not present

## 2022-04-17 LAB — SARS CORONAVIRUS 2 BY RT PCR: SARS Coronavirus 2 by RT PCR: NEGATIVE

## 2022-04-17 LAB — RAPID INFLUENZA A&B ANTIGENS
Influenza A (ARMC): NEGATIVE
Influenza B (ARMC): NEGATIVE

## 2022-04-17 MED ORDER — PREDNISONE 20 MG PO TABS: 40.0000 mg | ORAL_TABLET | Freq: Every day | ORAL | 0 refills | Status: AC

## 2022-04-17 MED ORDER — ALBUTEROL SULFATE HFA 108 (90 BASE) MCG/ACT IN AERS: 2.0000 | INHALATION_SPRAY | Freq: Four times a day (QID) | RESPIRATORY_TRACT | 0 refills | Status: AC | PRN

## 2022-04-17 NOTE — Discharge Instructions (Addendum)
Recommend start Prednisone 40mg  daily for 5 days then stop to help with inflammation and tightness in your chest. Recommend restart Albuterol inhaler 2 puffs every 4 to 6 hours as needed for cough/chest tightness. Continue to push fluids to help loosen up mucus in chest. May take OTC Delsym 2 teaspoons at night to help with cough. Follow-up in 4 to 5 days if not improving.

## 2022-04-17 NOTE — ED Provider Notes (Signed)
[facility]    CSN: [id number] Arrival date & time: 04/17/22  0808      History   Chief Complaint Chief Complaint  Patient presents with   Cough   chest congestion    Generalized Body Aches   Hoarse    HPI Rebecca Spencer is a 41 y.o. female.   41 year old female presents with chest irritation and cough that started yesterday. Today woke up with body aches, chills, and burning in her chest when coughing. Slight congestion but unable to cough any mucus up. Denies any distinct fever, runny nose, nasal congestion or GI symptoms. Has taken Mucinex with minimal relief. Smokes tobacco daily. No other family members ill. No other chronic health issues except occasional bronchitis. Takes no daily medication.   The history is provided by the patient.    History reviewed. No pertinent past medical history.  Patient Active Problem List   Diagnosis Date Noted   Supervision of high risk pregnancy in third trimester 07/30/2018   Advanced maternal age in multigravida, first trimester     Past Surgical History:  Procedure Laterality Date   GALLBLADDER SURGERY     TUBAL LIGATION Bilateral 07/31/2018   Procedure: POST PARTUM TUBAL LIGATION;  Surgeon: Ward, Elenora Fender, MD;  Location: [facility] ORS;  Service: Gynecology;  Laterality: Bilateral;    OB History     Gravida  4   Para  4   Term  4   Preterm      AB      Living  1      SAB      IAB      Ectopic      Multiple  0   Live Births  1            Home Medications    Prior to Admission medications   Medication Sig Start Date End Date Taking? Authorizing Provider  predniSONE (DELTASONE) 20 MG tablet Take 2 tablets (40 mg total) by mouth daily for 5 days. 04/17/22 04/22/22 Yes Rebecca Cairns, Ali Lowe, NP  albuterol (VENTOLIN HFA) 108 (90 Base) MCG/ACT inhaler Inhale 2 puffs into the lungs every 6 (six) hours as needed for wheezing or shortness of breath (or cough). 04/17/22   Sudie Grumbling, NP    Family  History Family History  Problem Relation Age of Onset   Cancer Maternal Grandmother    Heart disease Maternal Grandfather    Cancer Paternal Grandmother    Heart disease Paternal Grandfather     Social History Social History   Tobacco Use   Smoking status: Every Day    Packs/day: 0.50    Types: Cigarettes   Smokeless tobacco: Never  Vaping Use   Vaping Use: Never used  Substance Use Topics   Alcohol use: Never   Drug use: Never     Allergies   Latex   Review of Systems Review of Systems  Constitutional:  Positive for chills and fatigue. Negative for activity change, appetite change, diaphoresis and fever.  HENT:  Positive for sore throat (irritated) and voice change. Negative for congestion, ear discharge, ear pain, facial swelling, mouth sores, postnasal drip, rhinorrhea, sinus pressure, sinus pain and trouble swallowing.   Eyes:  Negative for discharge, redness and itching.  Respiratory:  Positive for cough and chest tightness.   Gastrointestinal:  Negative for diarrhea, nausea and vomiting.  Musculoskeletal:  Positive for arthralgias and myalgias. Negative for neck pain and neck stiffness.  Skin:  Negative  for color change and rash.  Allergic/Immunologic: Negative for environmental allergies, food allergies and immunocompromised state.  Neurological:  Negative for dizziness, tremors, seizures, syncope, light-headedness and numbness.  Hematological:  Negative for adenopathy. Does not bruise/bleed easily.     Physical Exam Triage Vital Signs ED Triage Vitals  Enc Vitals Group     BP 04/17/22 0823 (!) 153/99     Pulse Rate 04/17/22 0823 94     Resp 04/17/22 0823 16     Temp 04/17/22 0823 98.5 F (36.9 C)     Temp Source 04/17/22 0823 Oral     SpO2 04/17/22 0823 99 %     Weight --      Height --      Head Circumference --      Peak Flow --      Pain Score 04/17/22 0822 4     Pain Loc --      Pain Edu? --      Excl. in GC? --    No data found.  Updated  Vital Signs BP (!) 153/99 (BP Location: Left Arm)   Pulse 94   Temp 98.5 F (36.9 C) (Oral)   Resp 16   SpO2 99%   Visual Acuity Right Eye Distance:   Left Eye Distance:   Bilateral Distance:    Right Eye Near:   Left Eye Near:    Bilateral Near:     Physical Exam Vitals and nursing note reviewed.  Constitutional:      General: She is awake. She is not in acute distress.    Appearance: She is well-developed and well-groomed. She is ill-appearing.     Comments: She is sitting on the exam table in no acute distress but appears tired and ill.   HENT:     Head: Normocephalic and atraumatic.     Right Ear: Hearing, tympanic membrane, ear canal and external ear normal.     Left Ear: Hearing, tympanic membrane, ear canal and external ear normal.     Nose: Nose normal. No congestion.     Right Sinus: No maxillary sinus tenderness or frontal sinus tenderness.     Left Sinus: No maxillary sinus tenderness or frontal sinus tenderness.     Mouth/Throat:     Lips: Pink.     Mouth: Mucous membranes are moist.     Pharynx: Uvula midline. Posterior oropharyngeal erythema present. No pharyngeal swelling, oropharyngeal exudate or uvula swelling.  Eyes:     Extraocular Movements: Extraocular movements intact.     Conjunctiva/sclera: Conjunctivae normal.  Cardiovascular:     Rate and Rhythm: Normal rate and regular rhythm.     Heart sounds: Normal heart sounds. No murmur heard. Pulmonary:     Effort: Pulmonary effort is normal. No respiratory distress.     Breath sounds: Normal air entry. No decreased air movement. Examination of the right-upper field reveals wheezing. Examination of the left-upper field reveals wheezing. Wheezing present. No decreased breath sounds, rhonchi or rales.     Comments: Chest tight and slight wheezes present in upper lobes, especially when coughing.  Musculoskeletal:     Cervical back: Normal range of motion and neck supple.  Lymphadenopathy:     Cervical: No  cervical adenopathy.     Comments: But tender anterior cervical lymph nodes  Skin:    General: Skin is warm and dry.     Findings: No rash.  Neurological:     General: No focal deficit present.  Mental Status: She is alert and oriented to person, place, and time.  Psychiatric:        Mood and Affect: Mood normal.        Behavior: Behavior normal. Behavior is cooperative.        Thought Content: Thought content normal.        Judgment: Judgment normal.      UC Treatments / Results  Labs (all labs ordered are listed, but only abnormal results are displayed) Labs Reviewed  SARS CORONAVIRUS 2 BY RT PCR  RAPID INFLUENZA A&B ANTIGENS    EKG   Radiology No results found.  Procedures Procedures (including critical care time)  Medications Ordered in UC Medications - No data to display  Initial Impression / Assessment and Plan / UC Course  I have reviewed the triage vital signs and the nursing notes.  Pertinent labs & imaging results that were available during my care of the patient were reviewed by me and considered in my medical decision making (see chart for details).     Reviewed negative rapid Influenza and COVID test results with patient. Discussed that she probably has a viral illness. Due to chest tightness and history of bronchitis and smoking, will start Albuterol inhaler 2 puffs every 4 to 6 hours as needed for chest tightness and cough. May start Prednisone 40mg  daily for 5 days then stop to help with chest inflammation. Continue to push fluids to help loosen up mucus in chest. Rest. May take OTC Delsym 2 teaspoons at night to help with cough. Follow-up in 4 to 5 days if not improving or sooner if worsening.    Final Clinical Impressions(s) / UC Diagnoses   Final diagnoses:  Acute cough  Chest tightness  Laryngitis     Discharge Instructions      Recommend start Prednisone 40mg  daily for 5 days then stop to help with inflammation and tightness in your  chest. Recommend restart Albuterol inhaler 2 puffs every 4 to 6 hours as needed for cough/chest tightness. Continue to push fluids to help loosen up mucus in chest. May take OTC Delsym 2 teaspoons at night to help with cough. Follow-up in 4 to 5 days if not improving.      ED Prescriptions     Medication Sig Dispense Auth. Provider   albuterol (VENTOLIN HFA) 108 (90 Base) MCG/ACT inhaler Inhale 2 puffs into the lungs every 6 (six) hours as needed for wheezing or shortness of breath (or cough). 18 g , NP   predniSONE (DELTASONE) 20 MG tablet Take 2 tablets (40 mg total) by mouth daily for 5 days. 10 tablet Sione Baumgarten, , NP      PDMP not reviewed this encounter.   Sudie Grumbling, NP 04/17/22 [phone]

## 2022-04-17 NOTE — ED Triage Notes (Signed)
Pt reports chest congestion, body aches, cough and loss of voice since yesterday. States her chest burns when coughing. Denies any nasal congestion or fever. Tried taking mucinex with no relief.

## 2022-07-13 IMAGING — CR DG CHEST 2V
2 series · 2 of 2 positions shown · non-contrast
Comparison: Radiographs 01/08/2010.

CLINICAL DATA: Cough with green sputum. Chest congestion for 2
days.

EXAM:
CHEST - 2 VIEW

[chest pa]
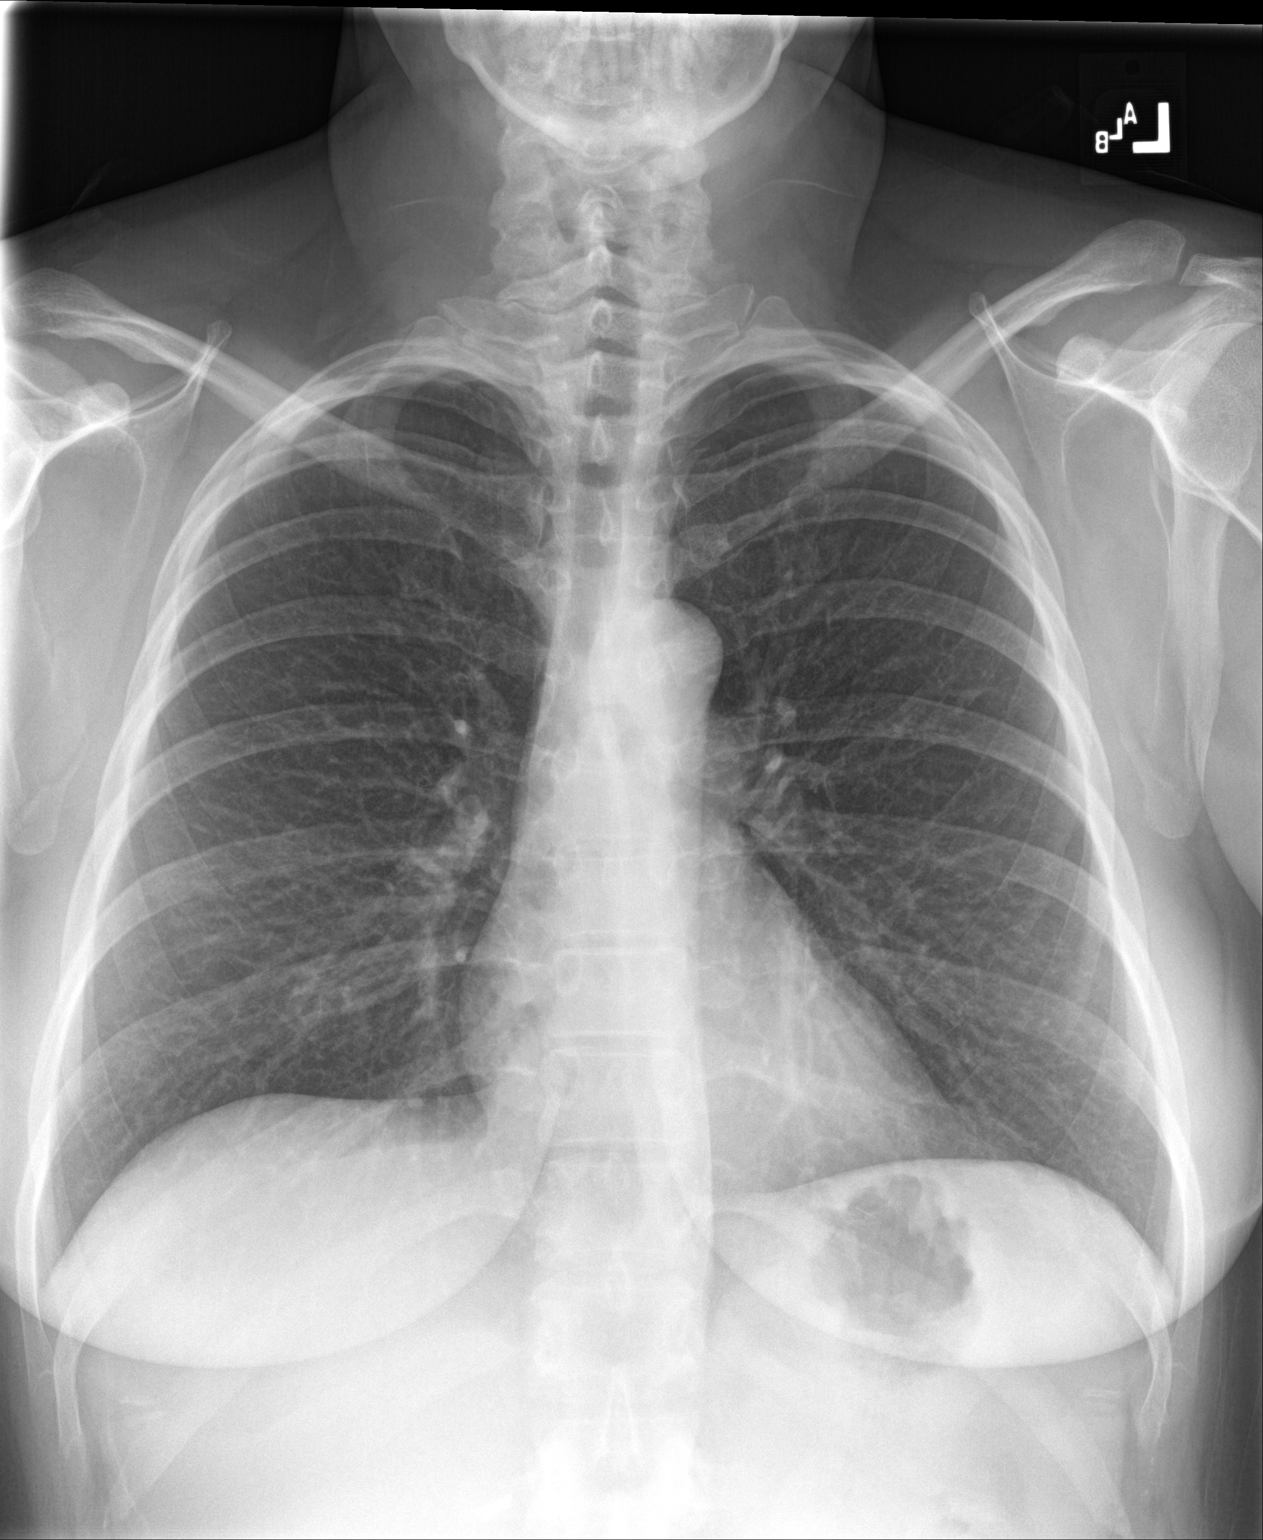

[chest lat]
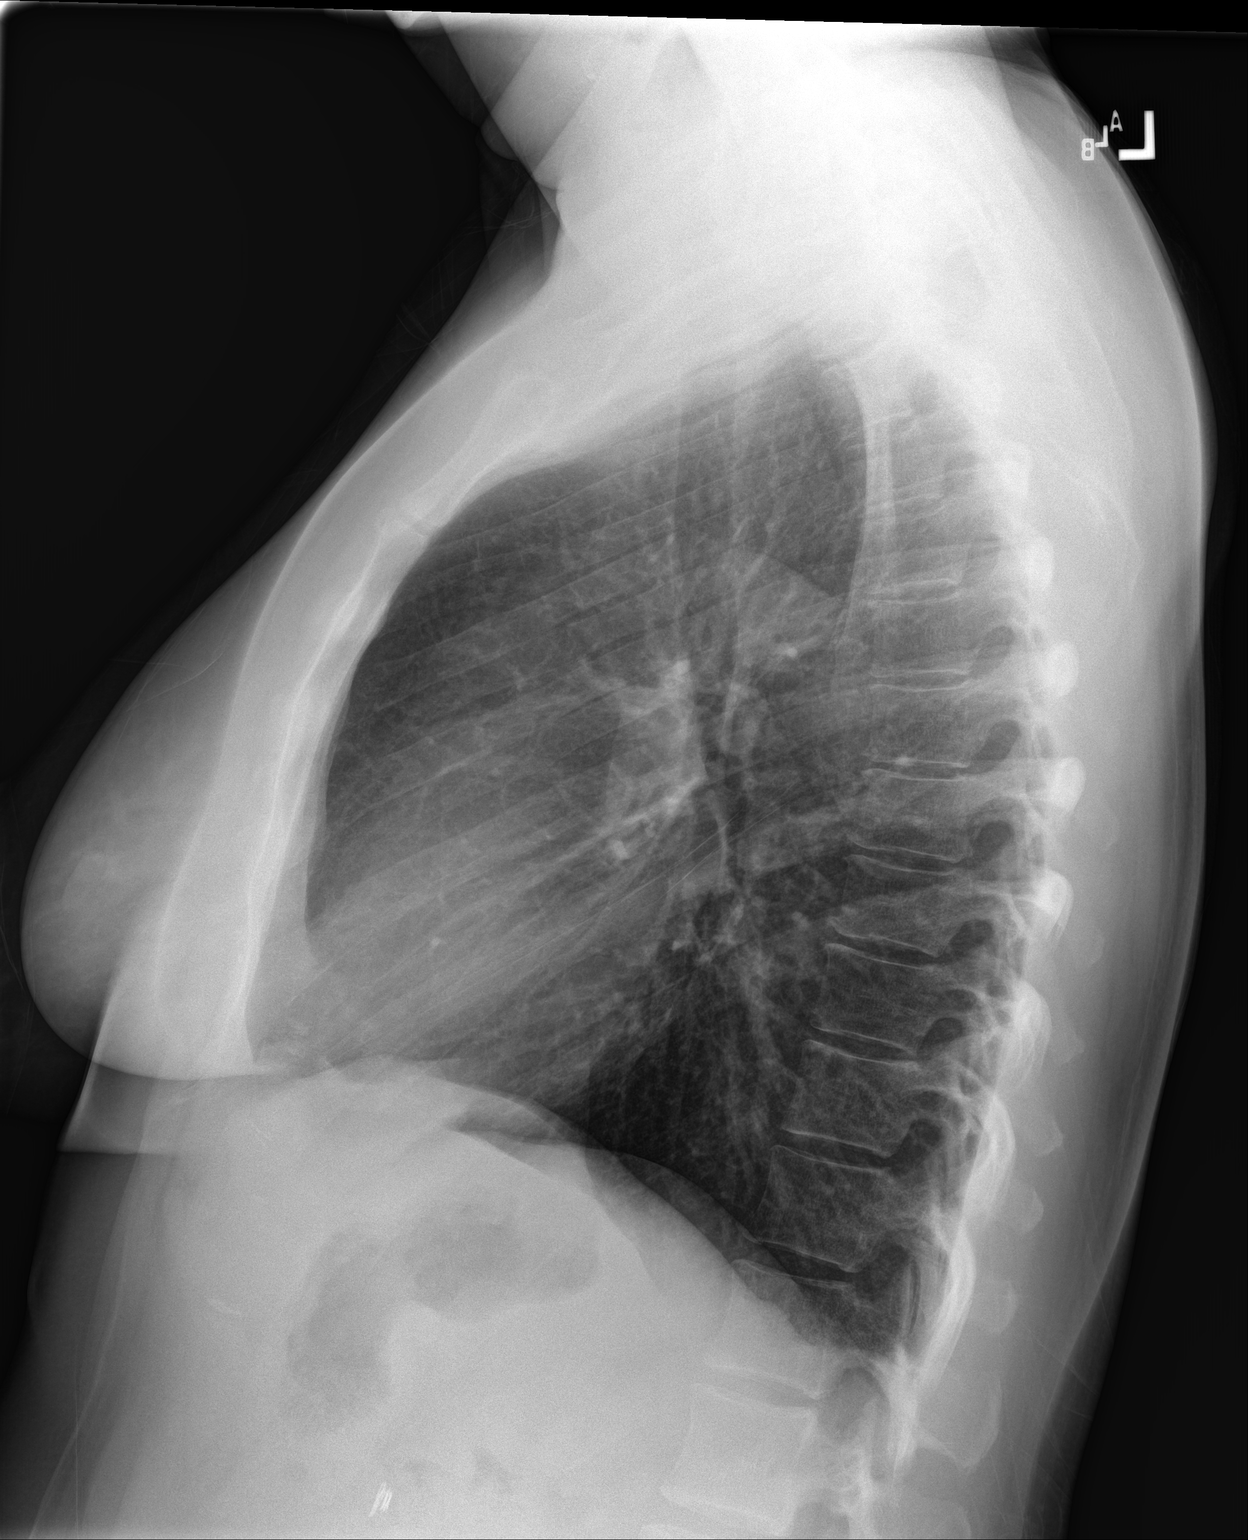

[2 of 2 positions shown; findings below may reference images not displayed]

FINDINGS: The heart size and mediastinal contours are normal. The lungs are
clear. There is no pleural effusion or pneumothorax. No acute
osseous findings are identified.
IMPRESSION: Stable chest.  No active cardiopulmonary process.
